# Patient Record
Sex: Female | Born: 1965 | Race: White | Hispanic: No | Marital: Married | Smoking: Never smoker
Health system: Southern US, Community
[De-identification: ages and names within clinical notes are randomized; demographics above are authoritative.]

## PROBLEM LIST (undated history)

## (undated) DIAGNOSIS — E079 Disorder of thyroid, unspecified: Secondary | ICD-10-CM

## (undated) DIAGNOSIS — R0600 Dyspnea, unspecified: Secondary | ICD-10-CM

## (undated) DIAGNOSIS — R002 Palpitations: Secondary | ICD-10-CM

## (undated) DIAGNOSIS — E039 Hypothyroidism, unspecified: Secondary | ICD-10-CM

## (undated) DIAGNOSIS — I82409 Acute embolism and thrombosis of unspecified deep veins of unspecified lower extremity: Secondary | ICD-10-CM

## (undated) DIAGNOSIS — R609 Edema, unspecified: Secondary | ICD-10-CM

## (undated) DIAGNOSIS — I8290 Acute embolism and thrombosis of unspecified vein: Secondary | ICD-10-CM

## (undated) HISTORY — DX: Acute embolism and thrombosis of unspecified deep veins of unspecified lower extremity: I82.409

## (undated) HISTORY — DX: Disorder of thyroid, unspecified: E07.9

## (undated) HISTORY — DX: Hypothyroidism, unspecified: E03.9

## (undated) HISTORY — DX: Edema, unspecified: R60.9

## (undated) HISTORY — DX: Dyspnea, unspecified: R06.00

## (undated) HISTORY — DX: Palpitations: R00.2

## (undated) HISTORY — DX: Acute embolism and thrombosis of unspecified vein: I82.90

---

## 1998-04-16 ENCOUNTER — Other Ambulatory Visit: Admission: RE | Admit: 1998-04-16 | Discharge: 1998-04-16 | Payer: Self-pay | Admitting: Obstetrics and Gynecology

## 1999-07-03 ENCOUNTER — Other Ambulatory Visit: Admission: RE | Admit: 1999-07-03 | Discharge: 1999-07-03 | Payer: Self-pay | Admitting: Obstetrics and Gynecology

## 2000-08-31 ENCOUNTER — Other Ambulatory Visit: Admission: RE | Admit: 2000-08-31 | Discharge: 2000-08-31 | Payer: Self-pay | Admitting: Obstetrics and Gynecology

## 2005-07-13 ENCOUNTER — Other Ambulatory Visit: Admission: RE | Admit: 2005-07-13 | Discharge: 2005-07-13 | Payer: Self-pay | Admitting: Obstetrics and Gynecology

## 2006-11-04 ENCOUNTER — Other Ambulatory Visit: Admission: RE | Admit: 2006-11-04 | Discharge: 2006-11-04 | Payer: Self-pay | Admitting: Obstetrics and Gynecology

## 2007-09-14 ENCOUNTER — Encounter: Admission: RE | Admit: 2007-09-14 | Discharge: 2007-09-14 | Payer: Self-pay | Admitting: Gastroenterology

## 2007-09-19 ENCOUNTER — Encounter: Admission: RE | Admit: 2007-09-19 | Discharge: 2007-09-19 | Payer: Self-pay | Admitting: Gastroenterology

## 2008-09-29 ENCOUNTER — Emergency Department (HOSPITAL_COMMUNITY): Admission: EM | Admit: 2008-09-29 | Discharge: 2008-09-30 | Payer: Self-pay | Admitting: Emergency Medicine

## 2009-02-26 ENCOUNTER — Encounter: Admission: RE | Admit: 2009-02-26 | Discharge: 2009-02-26 | Payer: Self-pay | Admitting: Allergy and Immunology

## 2010-03-25 ENCOUNTER — Emergency Department (HOSPITAL_BASED_OUTPATIENT_CLINIC_OR_DEPARTMENT_OTHER): Admission: EM | Admit: 2010-03-25 | Discharge: 2010-03-25 | Payer: Self-pay | Admitting: Emergency Medicine

## 2010-03-25 ENCOUNTER — Ambulatory Visit: Payer: Self-pay | Admitting: Interventional Radiology

## 2010-12-02 LAB — COMPREHENSIVE METABOLIC PANEL
AST: 34 U/L (ref 0–37)
Albumin: 4.1 g/dL (ref 3.5–5.2)
BUN: 8 mg/dL (ref 6–23)
Creatinine, Ser: 0.83 mg/dL (ref 0.4–1.2)
GFR calc Af Amer: >60 mL/min (ref >60)
Sodium: 141 mEq/L (ref 135–145)
Total Bilirubin: 0.7 mg/dL (ref 0.3–1.2)
Total Protein: 7.5 g/dL (ref 6.0–8.3)

## 2010-12-02 LAB — DIFFERENTIAL
Basophils Absolute: 0 10*3/uL (ref 0.0–0.1)
Basophils Relative: 0 % (ref 0–1)
Lymphocytes Relative: 11 % — ABNORMAL LOW (ref 12–46)
Lymphs Abs: 1.5 10*3/uL (ref 0.7–4.0)
Monocytes Relative: 4 % (ref 3–12)
Neutrophils Relative %: 85 % — ABNORMAL HIGH (ref 43–77)

## 2010-12-02 LAB — CBC
MCV: 90 fL (ref 78.0–100.0)
Platelets: 308 10*3/uL (ref 150–400)
RBC: 4.73 MIL/uL (ref 3.87–5.11)

## 2010-12-02 LAB — GC/CHLAMYDIA PROBE AMP, GENITAL: GC Probe Amp, Genital: NEGATIVE

## 2010-12-02 LAB — URINALYSIS, ROUTINE W REFLEX MICROSCOPIC
Bilirubin Urine: NEGATIVE
Ketones, ur: NEGATIVE mg/dL
Specific Gravity, Urine: 1.011 (ref 1.005–1.030)
Urobilinogen, UA: 0.2 mg/dL (ref 0.0–1.0)

## 2010-12-02 LAB — LIPASE, BLOOD: Lipase: 18 U/L (ref 11–59)

## 2010-12-02 LAB — WET PREP, GENITAL: Trich, Wet Prep: NONE SEEN

## 2013-01-26 ENCOUNTER — Other Ambulatory Visit: Payer: Self-pay | Admitting: Nurse Practitioner

## 2013-01-26 DIAGNOSIS — N644 Mastodynia: Secondary | ICD-10-CM

## 2013-01-26 DIAGNOSIS — N6315 Unspecified lump in the right breast, overlapping quadrants: Secondary | ICD-10-CM

## 2013-02-08 ENCOUNTER — Ambulatory Visit
Admission: RE | Admit: 2013-02-08 | Discharge: 2013-02-08 | Disposition: A | Discharge status: Home / Self Care | Payer: PRIVATE HEALTH INSURANCE | Source: Ambulatory Visit | Attending: Nurse Practitioner | Admitting: Nurse Practitioner

## 2013-02-08 DIAGNOSIS — N644 Mastodynia: Secondary | ICD-10-CM

## 2013-02-08 DIAGNOSIS — N6315 Unspecified lump in the right breast, overlapping quadrants: Secondary | ICD-10-CM

## 2013-09-11 ENCOUNTER — Ambulatory Visit (INDEPENDENT_AMBULATORY_CARE_PROVIDER_SITE_OTHER): Payer: No Typology Code available for payment source | Admitting: Podiatry

## 2013-09-11 ENCOUNTER — Encounter: Payer: Self-pay | Admitting: Podiatry

## 2013-09-11 ENCOUNTER — Ambulatory Visit (INDEPENDENT_AMBULATORY_CARE_PROVIDER_SITE_OTHER): Payer: No Typology Code available for payment source

## 2013-09-11 VITALS — BP 134/88 | HR 86 | Resp 12 | Ht 64.0 in | Wt 190.0 lb

## 2013-09-11 DIAGNOSIS — M722 Plantar fascial fibromatosis: Secondary | ICD-10-CM

## 2013-09-11 DIAGNOSIS — R52 Pain, unspecified: Secondary | ICD-10-CM

## 2013-09-11 MED ORDER — MELOXICAM 15 MG PO TABS: 15.0000 mg | ORAL_TABLET | Freq: Every day | ORAL | Status: DC

## 2013-09-11 MED ORDER — TRIAMCINOLONE ACETONIDE 10 MG/ML IJ SUSP
10.0000 mg | Freq: Once | INTRAMUSCULAR | Status: AC
  Administered 2013-09-11: 10 mg

## 2013-09-11 NOTE — Patient Instructions (Signed)

## 2013-09-11 NOTE — Progress Notes (Signed)
Subjective:     Patient ID: Brandy Dixon, female   DOB: Aug 13, 1966, 48 y.o.   MRN: [id number]  HPI patient presents stating my right heel has been hurting me for several months and making activity difficult especially painful when getting up in the morning   Review of Systems  All other systems reviewed and are negative.       Objective:   Physical Exam  Nursing note and vitals reviewed. Constitutional: She is oriented to person, place, and time.  Cardiovascular: Intact distal pulses.   Musculoskeletal: Normal range of motion.  Neurological: She is oriented to person, place, and time.  Skin: Skin is warm.   neurovascular status intact with no health history changes noted and normal muscle strength with no equinus condition noted. Patient is found to have significant discomfort right plantar fascia at the insertion of the tendon into the calcaneus with inflammation and fluid around the medial band     Assessment:     Plantar fasciitis of an acute nature right heel at the insertion of the tendon into the calcaneus    Plan:     Reviewed condition and x-ray. Injected the right plantar fascia 3 mg Kenalog 5 of Xylocaine Marcaine mixture and apply fascially brace with instructions. Placed on Mobic 15 mg daily

## 2013-09-11 NOTE — Progress Notes (Signed)
N-SORE L-RT FOOT D-2 MONTHS O-SLOWLY C-WORSE A-PRESSURE T-ADVIL

## 2013-09-25 ENCOUNTER — Ambulatory Visit: Payer: No Typology Code available for payment source | Admitting: Podiatry

## 2014-03-20 ENCOUNTER — Other Ambulatory Visit: Payer: Self-pay | Admitting: Nurse Practitioner

## 2014-03-20 DIAGNOSIS — N949 Unspecified condition associated with female genital organs and menstrual cycle: Secondary | ICD-10-CM

## 2014-03-20 DIAGNOSIS — N938 Other specified abnormal uterine and vaginal bleeding: Secondary | ICD-10-CM

## 2014-03-20 DIAGNOSIS — N925 Other specified irregular menstruation: Secondary | ICD-10-CM

## 2014-03-22 ENCOUNTER — Other Ambulatory Visit: Payer: PRIVATE HEALTH INSURANCE

## 2014-04-04 ENCOUNTER — Other Ambulatory Visit: Payer: PRIVATE HEALTH INSURANCE

## 2015-04-25 ENCOUNTER — Other Ambulatory Visit: Payer: Self-pay | Admitting: Pain Medicine

## 2015-04-25 DIAGNOSIS — A7749 Other ehrlichiosis: Secondary | ICD-10-CM

## 2015-04-25 DIAGNOSIS — G9340 Encephalopathy, unspecified: Secondary | ICD-10-CM

## 2015-04-29 ENCOUNTER — Other Ambulatory Visit (HOSPITAL_COMMUNITY): Payer: Self-pay | Admitting: Pain Medicine

## 2015-04-29 DIAGNOSIS — G9340 Encephalopathy, unspecified: Secondary | ICD-10-CM

## 2015-04-29 DIAGNOSIS — A7749 Other ehrlichiosis: Secondary | ICD-10-CM

## 2015-05-02 ENCOUNTER — Ambulatory Visit (HOSPITAL_COMMUNITY)
Admission: RE | Admit: 2015-05-02 | Discharge: 2015-05-02 | Disposition: A | Discharge status: Home / Self Care | Payer: Commercial Managed Care - PPO | Source: Ambulatory Visit | Attending: Pain Medicine | Admitting: Pain Medicine

## 2015-05-07 ENCOUNTER — Other Ambulatory Visit: Payer: PRIVATE HEALTH INSURANCE

## 2015-05-08 ENCOUNTER — Ambulatory Visit (HOSPITAL_COMMUNITY)
Admission: RE | Admit: 2015-05-08 | Discharge: 2015-05-08 | Disposition: A | Discharge status: Home / Self Care | Payer: Commercial Managed Care - PPO | Source: Ambulatory Visit | Attending: Pain Medicine | Admitting: Pain Medicine

## 2015-05-08 DIAGNOSIS — R42 Dizziness and giddiness: Secondary | ICD-10-CM | POA: Diagnosis not present

## 2015-05-08 DIAGNOSIS — A7749 Other ehrlichiosis: Secondary | ICD-10-CM

## 2015-05-08 DIAGNOSIS — G9340 Encephalopathy, unspecified: Secondary | ICD-10-CM

## 2016-06-02 ENCOUNTER — Other Ambulatory Visit: Payer: Self-pay | Admitting: Family Medicine

## 2016-06-02 DIAGNOSIS — R1011 Right upper quadrant pain: Secondary | ICD-10-CM

## 2016-06-09 ENCOUNTER — Other Ambulatory Visit: Payer: Self-pay | Admitting: Gastroenterology

## 2016-06-09 ENCOUNTER — Other Ambulatory Visit (HOSPITAL_COMMUNITY): Payer: Self-pay | Admitting: Gastroenterology

## 2016-06-09 DIAGNOSIS — R1013 Epigastric pain: Secondary | ICD-10-CM

## 2016-06-11 ENCOUNTER — Other Ambulatory Visit: Payer: Commercial Managed Care - PPO

## 2016-06-16 ENCOUNTER — Ambulatory Visit (HOSPITAL_COMMUNITY)
Admission: RE | Admit: 2016-06-16 | Discharge: 2016-06-16 | Disposition: A | Discharge status: Home / Self Care | Payer: Commercial Managed Care - PPO | Source: Ambulatory Visit | Attending: Gastroenterology | Admitting: Gastroenterology

## 2016-06-16 DIAGNOSIS — R1013 Epigastric pain: Secondary | ICD-10-CM | POA: Diagnosis not present

## 2016-06-16 MED ORDER — TECHNETIUM TC 99M MEBROFENIN IV KIT
5.2000 | PACK | Freq: Once | INTRAVENOUS | Status: AC | PRN
  Administered 2016-06-16: 5.2 via INTRAVENOUS

## 2017-01-29 ENCOUNTER — Ambulatory Visit
Admission: RE | Admit: 2017-01-29 | Discharge: 2017-01-29 | Disposition: A | Discharge status: Home / Self Care | Payer: Commercial Managed Care - PPO | Source: Ambulatory Visit | Attending: Cardiology | Admitting: Cardiology

## 2017-01-29 ENCOUNTER — Other Ambulatory Visit: Payer: Self-pay | Admitting: Cardiology

## 2017-01-29 DIAGNOSIS — Z01818 Encounter for other preprocedural examination: Secondary | ICD-10-CM

## 2017-02-14 HISTORY — PX: LIPOSUCTION EXTREMITIES: SUR830

## 2018-02-16 ENCOUNTER — Other Ambulatory Visit: Payer: Self-pay | Admitting: Physician Assistant

## 2018-02-16 DIAGNOSIS — R1011 Right upper quadrant pain: Secondary | ICD-10-CM

## 2018-02-16 DIAGNOSIS — R1115 Cyclical vomiting syndrome unrelated to migraine: Secondary | ICD-10-CM

## 2018-02-21 ENCOUNTER — Ambulatory Visit
Admission: RE | Admit: 2018-02-21 | Discharge: 2018-02-21 | Disposition: A | Discharge status: Home / Self Care | Payer: Commercial Managed Care - PPO | Source: Ambulatory Visit | Attending: Physician Assistant | Admitting: Physician Assistant

## 2018-02-21 DIAGNOSIS — R1011 Right upper quadrant pain: Secondary | ICD-10-CM

## 2018-02-21 DIAGNOSIS — R1115 Cyclical vomiting syndrome unrelated to migraine: Secondary | ICD-10-CM

## 2018-09-27 ENCOUNTER — Ambulatory Visit: Payer: Self-pay | Admitting: Cardiology

## 2018-09-29 ENCOUNTER — Other Ambulatory Visit: Payer: Self-pay | Admitting: Cardiology

## 2018-09-29 ENCOUNTER — Encounter: Payer: Self-pay | Admitting: Cardiology

## 2018-09-29 ENCOUNTER — Ambulatory Visit
Admission: RE | Admit: 2018-09-29 | Discharge: 2018-09-29 | Disposition: A | Discharge status: Home / Self Care | Payer: Commercial Managed Care - PPO | Source: Ambulatory Visit | Attending: Cardiology | Admitting: Cardiology

## 2018-09-29 ENCOUNTER — Ambulatory Visit: Payer: Commercial Managed Care - PPO | Admitting: Cardiology

## 2018-09-29 VITALS — BP 146/95 | HR 79 | Ht 64.0 in | Wt 166.8 lb

## 2018-09-29 DIAGNOSIS — Z01811 Encounter for preprocedural respiratory examination: Secondary | ICD-10-CM

## 2018-09-29 DIAGNOSIS — Z86718 Personal history of other venous thrombosis and embolism: Secondary | ICD-10-CM

## 2018-09-29 DIAGNOSIS — E079 Disorder of thyroid, unspecified: Secondary | ICD-10-CM | POA: Insufficient documentation

## 2018-09-29 DIAGNOSIS — R0600 Dyspnea, unspecified: Secondary | ICD-10-CM | POA: Insufficient documentation

## 2018-09-29 DIAGNOSIS — Z01810 Encounter for preprocedural cardiovascular examination: Secondary | ICD-10-CM | POA: Insufficient documentation

## 2018-09-29 DIAGNOSIS — R609 Edema, unspecified: Secondary | ICD-10-CM | POA: Diagnosis not present

## 2018-09-29 DIAGNOSIS — R002 Palpitations: Secondary | ICD-10-CM | POA: Insufficient documentation

## 2018-09-29 MED ORDER — APIXABAN 2.5 MG PO TABS: 2.5000 mg | ORAL_TABLET | Freq: Two times a day (BID) | ORAL | Status: AC

## 2018-09-29 NOTE — Progress Notes (Signed)
Subjective:   Brandy Dixon, female    DOB: 11/27/1965, 53 y.o.   MRN: [id number]  Kathyrn Lass, MD:  Chief Complaint  Patient presents with  . Pre-op Exam    medical clearance arm & leg surg 3/10    HPI   She was last seen by [place] in June 2018 and I suspected that she has unusual gaining weight with extreme fat deposition and although she thought she had edema, she in fact had lipedemia with folds of fat just beginning to occur at rest and ankle. She underwent routine treadmill stress test on 01/29/2017 and she had normal excess tolerance, hypertension at the exercise but otherwise no significant abnormality. Echocardiogram on 02/02/2017 was unremarkable without any significant valvular abnormality. She had contacted surgeon in [place] and had planned to have 3 sessions of lymphatic preserved liposuction. She is now scheduled for repeat session of liposuction of bilateral legs and scar tissue removal from her left arm and is here for preoperative cardiac risk stratification.   She reports that postoperatively she developed infection in her left arm and she also developed right leg DVT after the procedure likely from immobility and was on oral blood thinner for 3 months that resolved her DVT. In view of this, she will need prophylactic blood thinner after surgery. Dr. Delman Cheadle is her plastic surgeon performing the surgery on 10/25/2018. She has lost approximatley 50 lbs since the surgery. She has occasionally been exercising.   Denies any hypertension, hyperlipidemia, diabetes. She does have hypothyroidism. She has been evaluated with sleep study that she reports was negative. Denies any tobacco use.   Past Medical History:  Diagnosis Date  . Dyspnea   . Lipoedema   . Palpitations   . Thyroid disease    hypothyroidism    Past Surgical History:  Procedure Laterality Date  . LIPOSUCTION EXTREMITIES  02/2017   3 total    Family History  Problem Relation Age of Onset    . Thyroid disease Mother     Social History   Socioeconomic History  . Marital status: Married    Spouse name: Not on file  . Number of children: 2  . Years of education: Not on file  . Highest education level: Not on file  Occupational History  . Not on file  Social Needs  . Financial resource strain: Not on file  . Food insecurity:    Worry: Not on file    Inability: Not on file  . Transportation needs:    Medical: Not on file    Non-medical: Not on file  Tobacco Use  . Smoking status: Never Smoker  . Smokeless tobacco: Never Used  Substance and Sexual Activity  . Alcohol use: Yes    Comment: socially  . Drug use: No  . Sexual activity: Not on file  Lifestyle  . Physical activity:    Days per week: Not on file    Minutes per session: Not on file  . Stress: Not on file  Relationships  . Social connections:    Talks on phone: Not on file    Gets together: Not on file    Attends religious service: Not on file    Active member of club or organization: Not on file    Attends meetings of clubs or organizations: Not on file    Relationship status: Not on file  . Intimate partner violence:    Fear of current or ex partner: Not on file  Emotionally abused: Not on file    Physically abused: Not on file    Forced sexual activity: Not on file  Other Topics Concern  . Not on file  Social History Narrative  . Not on file    Current Meds  Medication Sig  . ARMOUR THYROID 120 MG tablet Take 1 tablet by mouth daily.  Marland Kitchen ascorbic acid (VITAMIN C) 500 MG/5ML syrup Take by mouth daily.  Marland Kitchen FERREX 150 150 MG capsule Take 1 tablet by mouth daily.  . Lactobacillus Rhamnosus, GG, (CULTURELLE) CAPS Take by mouth daily.  Marland Kitchen liothyronine (CYTOMEL) 5 MCG tablet Take 1 tablet by mouth 2 (two) times daily.  . pantoprazole (PROTONIX) 40 MG tablet Take 1 tablet by mouth daily.  . progesterone (PROMETRIUM) 100 MG capsule Take 1 tablet by mouth daily.   Current Facility-Administered  Medications for the 09/29/18 encounter (Office Visit) with Miquel Dunn, NP  Medication  . [START ON 10/25/2018] apixaban (ELIQUIS) tablet 2.5 mg     Review of Systems  Constitution: Negative for decreased appetite, malaise/fatigue, weight gain and weight loss.  Eyes: Negative for visual disturbance.  Cardiovascular: Negative for chest pain, claudication, dyspnea on exertion, leg swelling (occasional at the end of the day bilateral), orthopnea, palpitations and syncope.  Respiratory: Negative for hemoptysis and wheezing.   Endocrine: Negative for cold intolerance and heat intolerance.  Hematologic/Lymphatic: Does not bruise/bleed easily.  Skin: Negative for nail changes.  Musculoskeletal: Negative for muscle weakness and myalgias.  Gastrointestinal: Negative for abdominal pain, change in bowel habit, nausea and vomiting.  Neurological: Negative for difficulty with concentration, dizziness, focal weakness and headaches.  Psychiatric/Behavioral: Negative for altered mental status and suicidal ideas.  All other systems reviewed and are negative.      Objective:     Blood pressure (!) 146/95, pulse 79, height _0  (1.626 m), weight 166 lb 12.8 oz (75.7 kg), SpO2 100 %.  Echocardiogram 02/02/2017: Left ventricle cavity is normal in size. Normal global wall motion. Normal diastolic filling pattern, normal LAP. Calculated EF 55%. Mild (Grade I) mitral regurgitation. Mild tricuspid regurgitation. No evidence of pulmonary hypertension. Trace pulmonic regurgitation.  Treadmill exercise stress test 01/29/2017: Indication: Palpitations, SoB Resting EKG demonstrates NSR. The patient exercised according to Bruce Protocol, Total time recorded 8:29 min achieving max heart rate of 160 which was 93% of THR for age and 10.16 METS of work. Stress terminated due to THR (>85% MPHR)/MPHR met. There was no ST-T changes of ischemia with exercise stress test. There were no significant arrhythmias.  Hypertensive BP response, peak BP 240/72 mm Hg. Rec: No e/o ischemia by GXT. Exercise tolerence is normal. Hypertensive BP response, peak BP 240/72 mm Hg. No arrhythmias.Continue Preventive therapy.  EKG 09/29/2018: Normal sinus rhythm at 67 bpm, normal axis, no evidence of ischemia.   Physical Exam  Constitutional: She is oriented to person, place, and time. Vital signs are normal. She appears well-developed and well-nourished.  Surgery scars on bilateral legs and arms  HENT:  Head: Normocephalic and atraumatic.  Neck: Normal range of motion.  Cardiovascular: Normal rate, regular rhythm, normal heart sounds and intact distal pulses.  Pulmonary/Chest: Effort normal and breath sounds normal. No accessory muscle usage. No respiratory distress.  Abdominal: Soft. Bowel sounds are normal.  Musculoskeletal: Normal range of motion.  Neurological: She is alert and oriented to person, place, and time.  Skin: Skin is warm and dry.  Vitals reviewed.       Assessment & Recommendations:   1.  Preoperative cardiovascular examination Feel that she may be taken for surgery low risk for procedure. Has had negative GXT and normal echo in 2018. Has good functional capacity. Will obtain CBC, BMP, PT/INR, EKG, and CXR for preoperative workup per the request of her surgeon.   2. Lipoedema I am very proud of her progress since we saw her in 2018. She has been making significant diet and lifestyle changes and I have provided positive reinforcement. Encouraged her to continue postoperatively.   3. History of DVT (deep vein thrombosis) Likely related to immobility post-op. Will prescribe Eliquis 2.5 mg BID to be started as soon as possible after surgery or deemed safe by plastic surgeon. Will continue this for 1 month after surgery.   Again, I am very pleased with her progress. I will see her back on a PRN basis.    Jeri Lager, FNP-C [facility], [place] Office: [phone] Fax:  [phone]

## 2018-09-30 ENCOUNTER — Encounter: Payer: Self-pay | Admitting: Cardiology

## 2018-09-30 LAB — BASIC METABOLIC PANEL
BUN/Creatinine Ratio: 19 (ref 9–23)
BUN: 12 mg/dL (ref 6–24)
CO2: 20 mmol/L (ref 20–29)
Calcium: 9.5 mg/dL (ref 8.7–10.2)
Chloride: 106 mmol/L (ref 96–106)
Creatinine, Ser: 0.62 mg/dL (ref 0.57–1.00)
GFR calc Af Amer: 121 mL/min/{1.73_m2} (ref >59)
GFR calc non Af Amer: 105 mL/min/{1.73_m2} (ref >59)
GLUCOSE: 100 mg/dL — AB (ref 65–99)
Potassium: 4.4 mmol/L (ref 3.5–5.2)
Sodium: 140 mmol/L (ref 134–144)

## 2018-09-30 LAB — CBC WITH DIFFERENTIAL/PLATELET
Basophils Absolute: 0 10*3/uL (ref 0.0–0.2)
Basos: 1 %
EOS (ABSOLUTE): 0.1 10*3/uL (ref 0.0–0.4)
Eos: 1 %
Hematocrit: 43.1 % (ref 34.0–46.6)
Hemoglobin: 14.4 g/dL (ref 11.1–15.9)
IMMATURE GRANS (ABS): 0 10*3/uL (ref 0.0–0.1)
Immature Granulocytes: 0 %
LYMPHS: 20 %
Lymphocytes Absolute: 1.2 10*3/uL (ref 0.7–3.1)
MCH: 28.2 pg (ref 26.6–33.0)
MCHC: 33.4 g/dL (ref 31.5–35.7)
MCV: 85 fL (ref 79–97)
Monocytes Absolute: 0.6 10*3/uL (ref 0.1–0.9)
Monocytes: 9 %
Neutrophils Absolute: 4.2 10*3/uL (ref 1.4–7.0)
Neutrophils: 69 %
Platelets: 268 10*3/uL (ref 150–450)
RBC: 5.1 x10E6/uL (ref 3.77–5.28)
RDW: 14.2 % (ref 11.7–15.4)
WBC: 6.1 10*3/uL (ref 3.4–10.8)

## 2018-09-30 LAB — PROTIME-INR
INR: 1 (ref 0.8–1.2)
Prothrombin Time: 10.4 s (ref 9.1–12.0)

## 2018-10-12 ENCOUNTER — Telehealth: Payer: Self-pay

## 2018-10-12 NOTE — Telephone Encounter (Signed)
It looks like it was sent to the pharmacy. Not sure what happened. She will need Eliquis 2.5mg  BID 30 day supply

## 2018-10-12 NOTE — Telephone Encounter (Signed)
Pt called and left vm stating that you were suppose to send in a blood thinner before her surgery on 10/25/2018 she is getting it done in [place] and she leaves on the 6th

## 2018-10-13 ENCOUNTER — Other Ambulatory Visit: Payer: Self-pay

## 2018-10-13 DIAGNOSIS — Z01810 Encounter for preprocedural cardiovascular examination: Secondary | ICD-10-CM

## 2018-10-13 MED ORDER — APIXABAN 2.5 MG PO TABS: 2.5000 mg | ORAL_TABLET | Freq: Two times a day (BID) | ORAL | Status: DC

## 2018-10-17 ENCOUNTER — Other Ambulatory Visit: Payer: Self-pay

## 2018-10-17 MED ORDER — APIXABAN 2.5 MG PO TABS: 2.5000 mg | ORAL_TABLET | Freq: Two times a day (BID) | ORAL | 1 refills | Status: DC

## 2018-12-17 ENCOUNTER — Other Ambulatory Visit: Payer: Self-pay | Admitting: Cardiology

## 2018-12-19 NOTE — Telephone Encounter (Signed)
Please address

## 2018-12-20 NOTE — Telephone Encounter (Signed)
Patient has not returned call regarding Eliquis. Will deny refill request at this time.

## 2020-08-04 ENCOUNTER — Emergency Department (HOSPITAL_COMMUNITY)
Admission: EM | Admit: 2020-08-04 | Discharge: 2020-08-05 | Disposition: A | Discharge status: Home / Self Care | Payer: No Typology Code available for payment source | Attending: Emergency Medicine | Admitting: Emergency Medicine

## 2020-08-04 ENCOUNTER — Encounter (HOSPITAL_COMMUNITY): Payer: Self-pay

## 2020-08-04 ENCOUNTER — Other Ambulatory Visit: Payer: Self-pay

## 2020-08-04 ENCOUNTER — Emergency Department (HOSPITAL_COMMUNITY): Payer: No Typology Code available for payment source

## 2020-08-04 DIAGNOSIS — Z7901 Long term (current) use of anticoagulants: Secondary | ICD-10-CM | POA: Diagnosis not present

## 2020-08-04 DIAGNOSIS — N390 Urinary tract infection, site not specified: Secondary | ICD-10-CM | POA: Insufficient documentation

## 2020-08-04 DIAGNOSIS — Z79899 Other long term (current) drug therapy: Secondary | ICD-10-CM | POA: Diagnosis not present

## 2020-08-04 DIAGNOSIS — R300 Dysuria: Secondary | ICD-10-CM

## 2020-08-04 DIAGNOSIS — E039 Hypothyroidism, unspecified: Secondary | ICD-10-CM | POA: Diagnosis not present

## 2020-08-04 DIAGNOSIS — R109 Unspecified abdominal pain: Secondary | ICD-10-CM | POA: Diagnosis present

## 2020-08-04 LAB — URINALYSIS, ROUTINE W REFLEX MICROSCOPIC
Bilirubin Urine: NEGATIVE
Glucose, UA: NEGATIVE mg/dL
Ketones, ur: NEGATIVE mg/dL
Leukocytes,Ua: NEGATIVE
Nitrite: NEGATIVE
Protein, ur: NEGATIVE mg/dL
Specific Gravity, Urine: 1.021 (ref 1.005–1.030)
pH: 5 (ref 5.0–8.0)

## 2020-08-04 LAB — PREGNANCY, URINE: Preg Test, Ur: NEGATIVE

## 2020-08-04 MED ORDER — ACETAMINOPHEN 500 MG PO TABS
1000.0000 mg | ORAL_TABLET | Freq: Once | ORAL | Status: AC
  Administered 2020-08-04: 1000 mg via ORAL
  Filled 2020-08-04: qty 2

## 2020-08-04 NOTE — ED Triage Notes (Signed)
Pt reports that she started to have UTI symptoms a little over a week ago. Pt states she took one antibiotic, but it did not help. Pt reports she is now taking Bactrim and Oxybutynin. Pt states it feels like she is not fully emptying her bladder. Pt has hx of urinary retention.

## 2020-08-04 NOTE — ED Provider Notes (Signed)
Urinary symptoms - dysuria, 2 rounds of abx without improvement Sense of bladder being full - 110 cc on cath Pending CT scan  Plan: anticipate discharge after scan ?Pyridium, refer to urology  CT scan reviewed: no kidney or ureteral stones. There are degenerative changes of the lumbar spine that may be considered as contributing.   Feel she will need to continue evaluation in the outptient setting. She states her doctor has suggested referral to uro-GYN. She is encouraged to pursue this referral from her PCP.   Oxycodone provided for pain relief.      Elpidio Anis, PA-C 08/05/20 0054    Lorre Nick, MD 08/07/20 2308

## 2020-08-04 NOTE — ED Provider Notes (Signed)
[facility]-EMERGENCY DEPT Provider Note   CSN: [id number] Arrival date & time: 08/04/20  1955     History Chief Complaint  Patient presents with  . Urinary Tract Infection    Brandy Dixon is a 54 y.o. female.  The history is provided by the patient. No language interpreter was used.  Urinary Tract Infection Pain quality:  Aching and stabbing Pain severity:  Moderate Onset quality:  Gradual Duration:  1 week Timing:  Constant Progression:  Worsening Chronicity:  New Recent urinary tract infections: yes   Relieved by:  Nothing Worsened by:  Nothing Associated symptoms: abdominal pain   Pt reports she has had urinary retention in the past and had to have bladder drained. Pt is being treated for a uti. Pt is on second dose of antibiotics     Past Medical History:  Diagnosis Date  . Dyspnea   . Lipoedema   . Palpitations   . Thyroid disease    hypothyroidism    Patient Active Problem List   Diagnosis Date Noted  . Preoperative cardiovascular examination 09/29/2018  . History of DVT (deep vein thrombosis) 09/29/2018  . Palpitations   . Thyroid disease   . Dyspnea   . Lipoedema     Past Surgical History:  Procedure Laterality Date  . LIPOSUCTION EXTREMITIES  02/2017   3 total     OB History   No obstetric history on file.     Family History  Problem Relation Age of Onset  . Thyroid disease Mother     Social History   Tobacco Use  . Smoking status: Never Smoker  . Smokeless tobacco: Never Used  Substance Use Topics  . Alcohol use: Yes    Comment: socially  . Drug use: No    Home Medications Prior to Admission medications   Medication Sig Start Date End Date Taking? Authorizing Provider  apixaban (ELIQUIS) 2.5 MG TABS tablet Take 1 tablet (2.5 mg total) by mouth 2 (two) times daily. 10/17/18   Yates Decamp, MD  ARMOUR THYROID 120 MG tablet Take 1 tablet by mouth daily. 09/25/18   [provider]  ascorbic acid  (VITAMIN C) 500 MG/5ML syrup Take by mouth daily.    [provider]  FERREX 150 150 MG capsule Take 1 tablet by mouth daily. 08/18/18   [provider]  Lactobacillus Rhamnosus, GG, (CULTURELLE) CAPS Take by mouth daily. 03/22/17   [provider]  liothyronine (CYTOMEL) 5 MCG tablet Take 1 tablet by mouth 2 (two) times daily. 09/12/18   [provider]  meloxicam (MOBIC) 15 MG tablet Take 1 tablet (15 mg total) by mouth daily. Patient not taking: Reported on 09/29/2018 09/11/13   Lenn Sink, DPM  pantoprazole (PROTONIX) 40 MG tablet Take 1 tablet by mouth daily. 09/26/18   [provider]  progesterone (PROMETRIUM) 100 MG capsule Take 1 tablet by mouth daily. 09/19/18   [provider]    Allergies    Patient has no known allergies.  Review of Systems   Review of Systems  Gastrointestinal: Positive for abdominal pain.  All other systems reviewed and are negative.   Physical Exam Updated Vital Signs BP (!) 181/98 (BP Location: Left Arm)   Pulse 96   Temp 98 F (36.7 C) (Oral)   Resp 18   Ht 5\' 4"  (1.626 m)   Wt 75.3 kg   SpO2 97%   BMI 28.49 kg/m   Physical Exam Vitals and nursing note reviewed.  Constitutional:      Appearance: She is well-developed and well-nourished.  HENT:     Head: Normocephalic.  Eyes:     Extraocular Movements: EOM normal.  Cardiovascular:     Rate and Rhythm: Normal rate and regular rhythm.  Pulmonary:     Effort: Pulmonary effort is normal.  Abdominal:     General: Abdomen is flat. There is no distension.     Palpations: Abdomen is soft.  Genitourinary:    General: Normal vulva.  Musculoskeletal:        General: Normal range of motion.     Cervical back: Normal range of motion.  Neurological:     Mental Status: She is alert and oriented to person, place, and time.  Psychiatric:        Mood and Affect: Mood and affect and mood normal.     ED Results / Procedures / Treatments    Labs (all labs ordered are listed, but only abnormal results are displayed) Labs Reviewed  URINALYSIS, ROUTINE W REFLEX MICROSCOPIC  PREGNANCY, URINE    EKG None  Radiology No results found.  Procedures Procedures (including critical care time)  Medications Ordered in ED Medications - No data to display  ED Course  I have reviewed the triage vital signs and the nursing notes.  Pertinent labs & imaging results that were available during my care of the patient were reviewed by me and considered in my medical decision making (see chart for details).    MDM Rules/Calculators/A&P                          MDM:  Nurse reports difficulty doin in and out cath.  I did in and out  approx 100 cc of urine clear.  Pt is on menses.  Ua does not explain pt's discomfort.  Pt's care turned over to [facility] Ct renal pending.  Final Clinical Impression(s) / ED Diagnoses Final diagnoses:  None    Rx / DC Orders ED Discharge Orders    None       Osie Cheeks 08/04/20 2344    Lorre Nick, MD 08/07/20 2309

## 2020-08-05 MED ORDER — OXYCODONE HCL 5 MG PO TABS
5.0000 mg | ORAL_TABLET | Freq: Once | ORAL | Status: AC
  Administered 2020-08-05: 5 mg via ORAL
  Filled 2020-08-05: qty 1

## 2020-08-05 MED ORDER — OXYCODONE-ACETAMINOPHEN 5-325 MG PO TABS: 1.0000 | ORAL_TABLET | ORAL | 0 refills | Status: DC | PRN

## 2020-08-05 MED ORDER — OXYCODONE-ACETAMINOPHEN 5-325 MG PO TABS: 1.0000 | ORAL_TABLET | Freq: Once | ORAL | Status: DC

## 2020-08-05 NOTE — Discharge Instructions (Signed)
See your doctor for the referral to the uro-gynecologist as we discussed. Take Percocet for pain as needed.   If you develop any high fever, severe pain, or new concern, please return to the emergency department.

## 2020-08-12 NOTE — Progress Notes (Addendum)
[facility] New Patient Evaluation and Consultation  Referring Provider: Bridgette HabermannBriles, Tosha M, NP PCP: Bridgette HabermannBriles, Tosha M, NP Date of Service: 08/19/2020  SUBJECTIVE Chief Complaint: New Patient (Initial Visit) and Dysuria  History of Present Illness: Brandy Dixon is a 54 y.o. White or Caucasian female seen in consultation at the request of NP Briles for evaluation of dysuria.    Review of records from NP Briles and in Epic significant for: Patient reports frequency, dysuria and inability to void at times. Was previously on Rapaflo for urinary retention. Started on flomax and oxybutynin for recent episode. Was also given ciprofloxacin without improvement.   Went to ER On 08/04/20- Noted to have 110ml on straight cath of bladder. UA with large Hgb (21-50) and +bacteria. Was not sent for culture. Patient was discharged with oxycodone.   08/04/20 CT renal stone study:  IMPRESSION: 1. Colonic diverticulosis. 2. Marked severity degenerative changes at the levels of L2-L3 and L5-S1.  Urinary Symptoms: Does not leak urine.  Week before christmas, she had difficulty urinating. Had extreme pressure and urgency like her bladder was full. Sharp pain present.  Had no improvement with antibiotics. Would alternate urinating small amounts vs normal amounts. Happened previously about 5-6 years ago.  Currently: Day time voids: every few hours.  Nocturia: 0 times per night to void. Voiding dysfunction: she empties her bladder well.  does not use a catheter to empty bladder.  When urinating, she feels the need to urinate multiple times in a row Drinks: 2-30oz bottles of water per day, stopped drinking coffee or tea a few weeks ago.  Has been under some stress lately- new grandbaby and daughter has a wedding coming up  UTIs: 2 UTI's in the last year- had negative cultures but had symptoms.  Denies history of blood in urine and kidney or bladder stones. Had large Hgb on prior UA but had vaginal  bleeding at that time.   Pelvic Organ Prolapse Symptoms:                  She Denies a feeling of a bulge the vaginal area.   Bowel Symptom: Bowel movements: 1 time(s) per day Stool consistency: hard or soft  Straining: no.  Splinting: no.  Incomplete evacuation: no.  She Denies accidental bowel leakage / fecal incontinence Bowel regimen: none  Sexual Function Sexually active: yes.  Pain with sex: No  Pelvic Pain Denies regular pelvic pain but had pain on the left side of the pelvis/ bladder during her episode of bladder spasm.   Past Medical History:  Past Medical History:  Diagnosis Date  . Blood clot in vein   . Dyspnea   . Lipoedema   . Palpitations   . Thyroid disease    hypothyroidism     Past Surgical History:   Past Surgical History:  Procedure Laterality Date  . LIPOSUCTION EXTREMITIES  02/2017   3 total     Past OB/GYN History: G2 P2 Vaginal deliveries: 2,  Forceps/ Vacuum deliveries: 0, Cesarean section: 0 Menopausal: No, LMP- currently bleeding x3 weeks Contraception: none. Last pap smear was 4 years ago.  Any history of abnormal pap smears: no. On compounded estradiol cream, progesterone daily. Reports that she had a history of blood clots in the past while on OCPs.   Medications: She has a current medication list which includes the following prescription(s): armour thyroid, ascorbic acid, premarin, diazepam, ferrex 150, liothyronine, uribel, pantoprazole, and progesterone, and the following Facility-Administered Medications: apixaban.  Allergies: Patient has No Known Allergies.   Social History:  Social History   Tobacco Use  . Smoking status: Never Smoker  . Smokeless tobacco: Never Used  Substance Use Topics  . Alcohol use: Yes    Comment: socially  . Drug use: No    Relationship status: married Regular exercise: Yes: walking   Family History:   Family History  Problem Relation Age of Onset  . Thyroid disease Mother   . Breast  cancer Maternal Grandmother      Review of Systems: Review of Systems  Constitutional: Negative for fever, malaise/fatigue and weight loss.  Respiratory: Negative for cough, shortness of breath and wheezing.   Cardiovascular: Negative for chest pain, palpitations and leg swelling.  Gastrointestinal: Negative for abdominal pain and blood in stool.  Genitourinary: Negative for dysuria.       +abnormal periods  Musculoskeletal: Negative for myalgias.  Skin: Negative for rash.  Neurological: Negative for dizziness and headaches.  Endo/Heme/Allergies: Does not bruise/bleed easily.  Psychiatric/Behavioral: Negative for depression. The patient is not nervous/anxious.      OBJECTIVE Physical Exam: Vitals:   08/19/20 0936  BP: 127/85  Pulse: 91  Weight: 191 lb (86.6 kg)  Height: 5\' 4"  (1.626 m)    Physical Exam Constitutional:      General: She is not in acute distress. Pulmonary:     Effort: Pulmonary effort is normal.  Abdominal:     General: There is no distension.     Palpations: Abdomen is soft.     Tenderness: There is no abdominal tenderness. There is no rebound.  Musculoskeletal:        General: No swelling. Normal range of motion.  Skin:    General: Skin is warm and dry.     Findings: No rash.  Neurological:     Mental Status: She is alert and oriented to person, place, and time.  Psychiatric:        Mood and Affect: Mood normal.        Behavior: Behavior normal.     GU / Detailed Urogynecologic Evaluation:  Pelvic Exam: Normal external female genitalia; Bartholin's and Skene's glands normal in appearance; urethral meatus normal in appearance, no urethral masses or discharge.   CST: negative   Speculum exam reveals normal vaginal mucosa without atrophy. +vaginal bleeding present. Cervix normal appearance. Uterus normal single, nontender. Adnexa no mass, fullness, tenderness.     Pelvic floor strength III/V  Pelvic floor musculature: Right levator  non-tender, Right obturator non-tender, Left levator tender, Left obturator tender  POP-Q:   POP-Q  -0.5                                            Aa   -0.5                                           Ba  -7                                              C   4  Gh  5                                            Pb  10                                            tvl   -1.5                                            Ap  -1.5                                            Bp  -8.5                                              D     Rectal Exam:  Normal external rectum  Post-Void Residual (PVR) by Bladder Scan: In order to evaluate bladder emptying, we discussed obtaining a postvoid residual and she agreed to this procedure.  Procedure: The ultrasound unit was placed on the patient's abdomen in the suprapubic region after the patient had voided. A PVR of 3 ml was obtained by bladder scan.  Laboratory Results: POC urine: large blood but + vaginal bleeding on exam  I visualized the urine specimen, noting the specimen to be amber/ red tinged  ASSESSMENT AND PLAN Ms. Guhl is a 54 y.o. with:  1. Bladder pain   2. Levator spasm   3. Frequency of urination   4. Abnormal uterine bleeding   5. Prolapse of anterior vaginal wall    1. Bladder pain For irritative bladder we reviewed treatment options including altering her diet to avoid irritative beverages and foods as well as attempting to decrease stress and other exacerbating factors.  We also discussed using pyridium and similar over-the-counter medications for pain relief as needed. We discussed the pentad of medications including Elmiron, Tums, an antihistamine such as Vistaril, amitriptyline, and L-arginine.  We also discussed in-office bladder instillations for pain flares, as well as cystoscopy with hydrodistention in the operating room, which can be both diagnostic and therapeutic. She  was also given information on the IC Network at https://www.ic-network.com for bladder diet suggestions and patient forums for support. - Her symptoms a few weeks ago were likely due to a flare. She had improvement after stopping coffee/ tea and will continue to only drink water. She also feels like she was very stressed around the holidays and will work on incorporating a mindfulness or exercise regimen to reduce stress.  - Has tried pyridium previously and did not help her symptoms. Will prescribe Uribel for when she has symptoms.  - Also recommended starting L-arginine since this supplement is available over the counter.   2. Levator spasm The origin of pelvic floor muscle spasm can be multifactorial, including primary, reactive to a different pain source, trauma, or even part of a centralized pain syndrome.Treatment options include pelvic  floor physical therapy, local (vaginal) or oral  muscle relaxants, or centrally acting pain medications.   - Reviewed how levator spasm can exacerbate bladder symptoms and sensation of incomplete emptying. She is interested in pelvic floor physical therapy and referral was placed.  - Also prescribed vaginal valium 5mg  tab to place as needed for pelvic pain/ spasm. Since she was having vaginal bleeding, discussed the pill can also be placed rectally if needed.   3. Urinary frequency - POC urine today negative for infection.   4. AUB - Patient reports she has been bleeding for 3 weeks. She has been prescribed a compounded estradiol cream and progresterone daily by her PCP. However, she reports history of blood clots while on OCPs. While I do not have the prescription information, based on her report, I believe the estrogen cream is a systemic hormonal cream. Discussed that systemic estrogen would not be indicated with this history and she should speak to the prescribing physician.  - She requested referral to gynecology for further management and referral placed.    5. Stage II anterior, Stage I posterior, Stage I apical prolapse - She is currently asymptomatic and I do not feel this is contributing to her symptoms of bladder pain/ pressure. Discussed that we can reevaluate if she becomes symptomatic.   Return 2 months to assess progress   , MD   Medical Decision Making:  - Reviewed/ ordered a clinical laboratory test - Review and summation of prior records

## 2020-08-19 ENCOUNTER — Other Ambulatory Visit: Payer: Self-pay

## 2020-08-19 ENCOUNTER — Encounter: Payer: Self-pay | Admitting: Obstetrics and Gynecology

## 2020-08-19 ENCOUNTER — Ambulatory Visit (INDEPENDENT_AMBULATORY_CARE_PROVIDER_SITE_OTHER): Payer: No Typology Code available for payment source | Admitting: Obstetrics and Gynecology

## 2020-08-19 VITALS — BP 127/85 | HR 91 | Ht 64.0 in | Wt 191.0 lb

## 2020-08-19 DIAGNOSIS — R3989 Other symptoms and signs involving the genitourinary system: Secondary | ICD-10-CM

## 2020-08-19 DIAGNOSIS — N8110 Cystocele, unspecified: Secondary | ICD-10-CM | POA: Diagnosis not present

## 2020-08-19 DIAGNOSIS — N939 Abnormal uterine and vaginal bleeding, unspecified: Secondary | ICD-10-CM

## 2020-08-19 DIAGNOSIS — R350 Frequency of micturition: Secondary | ICD-10-CM

## 2020-08-19 DIAGNOSIS — M62838 Other muscle spasm: Secondary | ICD-10-CM

## 2020-08-19 LAB — POCT URINALYSIS DIPSTICK
Appearance: ABNORMAL
Bilirubin, UA: NEGATIVE
Glucose, UA: NEGATIVE
Ketones, UA: NEGATIVE
Leukocytes, UA: NEGATIVE
Nitrite, UA: NEGATIVE
Protein, UA: NEGATIVE
Spec Grav, UA: 1.025 (ref 1.010–1.025)
Urobilinogen, UA: NEGATIVE E.U./dL — AB
pH, UA: 5 (ref 5.0–8.0)

## 2020-08-19 MED ORDER — DIAZEPAM 5 MG PO TABS: ORAL_TABLET | ORAL | 0 refills | Status: DC

## 2020-08-19 MED ORDER — URIBEL 118 MG PO CAPS: 1.0000 | ORAL_CAPSULE | Freq: Four times a day (QID) | ORAL | 2 refills | Status: DC | PRN

## 2020-08-19 NOTE — Patient Instructions (Signed)
Interstitial Cystitis/ Bladder Pain treatment:  . Avoid irriative foods and beverages and identify other triggers . Work to decrease stress- meditation, yoga . Medications for bladder pain: pyridium (Azo), methenamine, Uribel . Medications: amitiptyline, hydroxyzine (anti-histamine), tums, L-arginine, Elmiron  . Bladder instillations for pain flares . Cystoscopy with hydrodistention  The IC Network at https://www.ic-network.com is helpful for bladder diet suggestions and forums for support.  I will prescribe valium 5 mg pills to place vaginally up to 2 times a day for vaginal muscle spasms. Start at night and take one every night for the next several weeks to see if it improves your symptoms. Once you are improving you can taper off the medication and just use as needed. If the medication makes you drowsy then only use at bedtime and/or we can reduce the dose. Do not use gel to place it, as that will prevent the tablet from dissolving.  Instead place 1-2 drops of water on the table before inserting it in the vagina. If the pills do not dissolve well we can switch to a special compounded suppository. Let me know how you are doing on the medication and if you have any questions.

## 2020-09-11 ENCOUNTER — Encounter: Payer: No Typology Code available for payment source | Admitting: Obstetrics & Gynecology

## 2020-09-17 DIAGNOSIS — M5136 Other intervertebral disc degeneration, lumbar region: Secondary | ICD-10-CM | POA: Insufficient documentation

## 2020-10-21 NOTE — Progress Notes (Deleted)
[facility] Return Visit  SUBJECTIVE  History of Present Illness: Brandy Dixon is a 55 y.o. female seen in follow-up for bladder pain and levator spasm. Plan at last visit was to reduce bladder irritants, and start uribel and L-arginine for bladder pain. She was also prescribed vaginal valium and pelvic floor PT for her levator spasm. She was referred to gynecology for AUB.     Past Medical History: Patient  has a past medical history of Blood clot in vein, Dyspnea, Lipoedema, Palpitations, and Thyroid disease.   Past Surgical History: She  has a past surgical history that includes Liposuction extremities (02/2017).   Medications: She has a current medication list which includes the following prescription(s): armour thyroid, ascorbic acid, premarin, diazepam, ferrex 150, liothyronine, uribel, pantoprazole, and progesterone, and the following Facility-Administered Medications: apixaban.   Allergies: Patient has No Known Allergies.   Social History: Patient  reports that she has never smoked. She has never used smokeless tobacco. She reports current alcohol use. She reports that she does not use drugs.      OBJECTIVE     Physical Exam: There were no vitals filed for this visit. Gen: No apparent distress, A&O x 3.  Detailed Urogynecologic Evaluation:  Deferred. Prior exam showed:  No flowsheet data found.     ASSESSMENT AND PLAN    Brandy Dixon is a 55 y.o. with:  No diagnosis found.

## 2020-10-23 ENCOUNTER — Ambulatory Visit: Payer: No Typology Code available for payment source | Admitting: Obstetrics and Gynecology

## 2020-10-30 NOTE — Progress Notes (Deleted)
[facility] Return Visit  SUBJECTIVE  History of Present Illness: Brandy Dixon is a 55 y.o. female seen in follow-up for bladder pain and levator spasm. Plan at last visit was to start vaginal valium and referral was placed for pelvic PT. For bladder pain she started uribel and L-arginine.     Past Medical History: Patient  has a past medical history of Blood clot in vein, Dyspnea, Lipoedema, Palpitations, and Thyroid disease.   Past Surgical History: She  has a past surgical history that includes Liposuction extremities (02/2017).   Medications: She has a current medication list which includes the following prescription(s): armour thyroid, ascorbic acid, premarin, diazepam, ferrex 150, liothyronine, uribel, pantoprazole, and progesterone, and the following Facility-Administered Medications: apixaban.   Allergies: Patient has No Known Allergies.   Social History: Patient  reports that she has never smoked. She has never used smokeless tobacco. She reports current alcohol use. She reports that she does not use drugs.      OBJECTIVE     Physical Exam: There were no vitals filed for this visit. Gen: No apparent distress, A&O x 3.  Detailed Urogynecologic Evaluation:  Deferred. Prior exam showed:  No flowsheet data found.     ASSESSMENT AND PLAN    Brandy Dixon is a 55 y.o. with:  No diagnosis found.

## 2020-11-05 ENCOUNTER — Ambulatory Visit: Payer: No Typology Code available for payment source | Admitting: Obstetrics and Gynecology

## 2020-11-11 ENCOUNTER — Other Ambulatory Visit: Payer: Self-pay | Admitting: Family Medicine

## 2020-11-11 DIAGNOSIS — R1013 Epigastric pain: Secondary | ICD-10-CM

## 2020-11-11 DIAGNOSIS — R109 Unspecified abdominal pain: Secondary | ICD-10-CM

## 2020-11-15 ENCOUNTER — Encounter (HOSPITAL_COMMUNITY): Payer: Self-pay

## 2020-11-15 ENCOUNTER — Emergency Department (HOSPITAL_COMMUNITY): Payer: No Typology Code available for payment source

## 2020-11-15 ENCOUNTER — Other Ambulatory Visit: Payer: Self-pay

## 2020-11-15 ENCOUNTER — Emergency Department (HOSPITAL_COMMUNITY)
Admission: EM | Admit: 2020-11-15 | Discharge: 2020-11-16 | Disposition: A | Discharge status: Home / Self Care | Payer: No Typology Code available for payment source | Attending: Emergency Medicine | Admitting: Emergency Medicine

## 2020-11-15 DIAGNOSIS — R1084 Generalized abdominal pain: Secondary | ICD-10-CM | POA: Insufficient documentation

## 2020-11-15 DIAGNOSIS — R1013 Epigastric pain: Secondary | ICD-10-CM | POA: Diagnosis not present

## 2020-11-15 DIAGNOSIS — Z79899 Other long term (current) drug therapy: Secondary | ICD-10-CM | POA: Insufficient documentation

## 2020-11-15 DIAGNOSIS — R1033 Periumbilical pain: Secondary | ICD-10-CM | POA: Insufficient documentation

## 2020-11-15 DIAGNOSIS — R110 Nausea: Secondary | ICD-10-CM | POA: Insufficient documentation

## 2020-11-15 DIAGNOSIS — R140 Abdominal distension (gaseous): Secondary | ICD-10-CM | POA: Insufficient documentation

## 2020-11-15 DIAGNOSIS — R109 Unspecified abdominal pain: Secondary | ICD-10-CM

## 2020-11-15 DIAGNOSIS — R1011 Right upper quadrant pain: Secondary | ICD-10-CM | POA: Diagnosis present

## 2020-11-15 DIAGNOSIS — E039 Hypothyroidism, unspecified: Secondary | ICD-10-CM | POA: Insufficient documentation

## 2020-11-15 LAB — URINALYSIS, ROUTINE W REFLEX MICROSCOPIC
Bilirubin Urine: NEGATIVE
Glucose, UA: NEGATIVE mg/dL
Hgb urine dipstick: NEGATIVE
Ketones, ur: NEGATIVE mg/dL
Leukocytes,Ua: NEGATIVE
Nitrite: NEGATIVE
Protein, ur: NEGATIVE mg/dL
Specific Gravity, Urine: 1.008 (ref 1.005–1.030)
pH: 5 (ref 5.0–8.0)

## 2020-11-15 LAB — COMPREHENSIVE METABOLIC PANEL
ALT: 32 U/L (ref 0–44)
AST: 23 U/L (ref 15–41)
Albumin: 4.5 g/dL (ref 3.5–5.0)
Alkaline Phosphatase: 51 U/L (ref 38–126)
Anion gap: 9 (ref 5–15)
BUN: 11 mg/dL (ref 6–20)
CO2: 24 mmol/L (ref 22–32)
Calcium: 9.7 mg/dL (ref 8.9–10.3)
Chloride: 105 mmol/L (ref 98–111)
Creatinine, Ser: 0.76 mg/dL (ref 0.44–1.00)
GFR, Estimated: >60 mL/min (ref >60)
Glucose, Bld: 107 mg/dL — ABNORMAL HIGH (ref 70–99)
Potassium: 4.1 mmol/L (ref 3.5–5.1)
Sodium: 138 mmol/L (ref 135–145)
Total Bilirubin: 1 mg/dL (ref 0.3–1.2)
Total Protein: 7.8 g/dL (ref 6.5–8.1)

## 2020-11-15 LAB — I-STAT BETA HCG BLOOD, ED (MC, WL, AP ONLY): I-stat hCG, quantitative: <5 m[IU]/mL (ref <5)

## 2020-11-15 LAB — CBC
HCT: 43.4 % (ref 36.0–46.0)
Hemoglobin: 15.1 g/dL — ABNORMAL HIGH (ref 12.0–15.0)
MCH: 30.4 pg (ref 26.0–34.0)
MCHC: 34.8 g/dL (ref 30.0–36.0)
MCV: 87.3 fL (ref 80.0–100.0)
Platelets: 276 10*3/uL (ref 150–400)
RBC: 4.97 MIL/uL (ref 3.87–5.11)
RDW: 12.1 % (ref 11.5–15.5)
WBC: 5.9 10*3/uL (ref 4.0–10.5)
nRBC: 0 % (ref 0.0–0.2)

## 2020-11-15 LAB — TROPONIN I (HIGH SENSITIVITY)
Troponin I (High Sensitivity): 3 ng/L (ref <18)
Troponin I (High Sensitivity): <2 ng/L (ref <18)

## 2020-11-15 LAB — LIPASE, BLOOD: Lipase: 31 U/L (ref 11–51)

## 2020-11-15 MED ORDER — ONDANSETRON 4 MG PO TBDP
4.0000 mg | ORAL_TABLET | Freq: Once | ORAL | Status: AC
  Administered 2020-11-15: 4 mg via ORAL
  Filled 2020-11-15: qty 1

## 2020-11-15 MED ORDER — IOHEXOL 300 MG/ML  SOLN
100.0000 mL | Freq: Once | INTRAMUSCULAR | Status: AC | PRN
  Administered 2020-11-15: 100 mL via INTRAVENOUS

## 2020-11-15 MED ORDER — IBUPROFEN 800 MG PO TABS
800.0000 mg | ORAL_TABLET | Freq: Once | ORAL | Status: AC
  Administered 2020-11-15: 800 mg via ORAL
  Filled 2020-11-15: qty 1

## 2020-11-15 MED ORDER — MORPHINE SULFATE (PF) 4 MG/ML IV SOLN
4.0000 mg | Freq: Once | INTRAVENOUS | Status: AC
  Administered 2020-11-15: 4 mg via INTRAVENOUS
  Filled 2020-11-15: qty 1

## 2020-11-15 MED ORDER — FENTANYL CITRATE (PF) 100 MCG/2ML IJ SOLN
50.0000 ug | Freq: Once | INTRAMUSCULAR | Status: AC
  Administered 2020-11-15: 50 ug via INTRAVENOUS
  Filled 2020-11-15: qty 2

## 2020-11-15 NOTE — ED Provider Notes (Signed)
[facility]-EMERGENCY DEPT Provider Note   CSN: [id number] Arrival date & time: 11/15/20  1730     History Chief Complaint  Patient presents with  . Abdominal Pain    Brandy Katoicole Dixon is a 55 y.o. female who presents to the emergency department for worsening right upper quadrant pain, nausea, and decreased ability to pass stool and gas.  Patient has appointment to follow-up with gastroenterology next week.  But states that the pain has become so severe she is unable to sleep, and is increasingly worse when she eats.  She states pain becomes 10 of 10 after she eats so she has been taking in very little p.o. for the last 10 days.  She states last bowel movement was yesterday that was very small in amount and difficult for her to pass.  Additionally states she feels she has a lot of bowel gas but is unable to pass it.  She denies any chest pain or shortness of breath. Endorses abdominal distention/ bloating.  Personally reviewed this patient's medical records she has history of palpitations, thyroid disease, and DVT.  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     HPI     Past Medical History:  Diagnosis Date  . Blood clot in vein   . Dyspnea   . Lipoedema   . Palpitations   . Thyroid disease    hypothyroidism    Patient Active Problem List   Diagnosis Date Noted  . Preoperative cardiovascular examination 09/29/2018  . History of DVT (deep vein thrombosis) 09/29/2018  . Palpitations   . Thyroid disease   . Dyspnea   . Lipoedema     Past Surgical History:  Procedure Laterality Date  . LIPOSUCTION EXTREMITIES  02/2017   3 total     OB History    Gravida  2   Para  2   Term  2   Preterm      AB      Living        SAB      IAB      Ectopic      Multiple      Live Births              Family History  Problem Relation Age of Onset  . Thyroid disease Mother   . Breast cancer Maternal Grandmother     Social History    Tobacco Use  . Smoking status: Never Smoker  . Smokeless tobacco: Never Used  Substance Use Topics  . Alcohol use: Yes    Comment: socially  . Drug use: No    Home Medications Prior to Admission medications   Medication Sig Start Date End Date Taking? Authorizing Provider  ascorbic acid (VITAMIN C) 500 MG/5ML syrup Take 500 mg by mouth daily.   Yes [provider]  clarithromycin (BIAXIN) 500 MG tablet Take 500 mg by mouth 2 (two) times daily. Start date : 11/12/20 11/12/20  Yes [provider]  eszopiclone (LUNESTA) 2 MG TABS tablet Take 2 mg by mouth at bedtime. 10/23/20  Yes [provider]  FERREX 150 150 MG capsule Take 1 tablet by mouth daily. 08/18/18  Yes [provider]  fluconazole (DIFLUCAN) 150 MG tablet Take 150 mg by mouth every 3 (three) days. 11/12/20  Yes [provider]  liothyronine (CYTOMEL) 5 MCG tablet Take 5 mcg by mouth daily. 09/12/18  Yes [provider]  ondansetron (ZOFRAN-ODT) 4 MG disintegrating tablet Take 4 mg by  mouth 3 (three) times daily as needed for nausea or vomiting. 11/06/20  Yes [provider]  pantoprazole (PROTONIX) 40 MG tablet Take 40 mg by mouth 2 (two) times daily. 09/26/18  Yes [provider]  progesterone (PROMETRIUM) 100 MG capsule Take 1 tablet by mouth daily. 09/19/18  Yes [provider]  Semaglutide,0.25 or 0.5MG /DOS, (OZEMPIC, 0.25 OR 0.5 MG/DOSE,) 2 MG/1.5ML SOPN Inject 0.25 mg into the skin once a week.   Yes [provider]  thyroid (ARMOUR) 90 MG tablet Take 90 mg by mouth daily.   Yes [provider]  valACYclovir (VALTREX) 1000 MG tablet Take 1,000 mg by mouth daily. 10/25/20  Yes [provider]  amoxicillin (AMOXIL) 875 MG tablet Take 875 mg by mouth 2 (two) times daily. Start date : 11/12/20 11/12/20   [provider]  diazepam (VALIUM) 5 MG tablet Place 1 tablet vaginally nightly as needed for muscle spasm/ pelvic  pain. Patient not taking: No sig reported 08/19/20   Marguerita Beards, MD  Meth-Hyo-M Salley Hews Phos-Ph Sal (URIBEL) 118 MG CAPS Take 1 capsule (118 mg total) by mouth 4 (four) times daily as needed (bladder pain). Patient not taking: No sig reported 08/19/20   Marguerita Beards, MD    Allergies    Patient has no known allergies.  Review of Systems   Review of Systems  Constitutional: Positive for appetite change and fatigue. Negative for activity change, chills, diaphoresis and fever.  HENT: Negative.   Respiratory: Negative.   Cardiovascular: Negative.   Gastrointestinal: Positive for abdominal pain, constipation and nausea. Negative for diarrhea and vomiting.  Genitourinary: Negative for difficulty urinating, dysuria, hematuria, pelvic pain and urgency.  Musculoskeletal: Negative.   Neurological: Negative.   Psychiatric/Behavioral: Negative.     Physical Exam Updated Vital Signs BP 125/76   Pulse 77   Temp 98.4 F (36.9 C) (Oral)   Resp 15   Ht 5\' 4"  (1.626 m)   Wt 79.4 kg   SpO2 97%   BMI 30.04 kg/m   Physical Exam Vitals and nursing note reviewed.  Constitutional:      Appearance: She is ill-appearing.  HENT:     Head: Normocephalic and atraumatic.     Nose: Nose normal.     Mouth/Throat:     Mouth: Mucous membranes are moist.     Pharynx: Oropharynx is clear. Uvula midline. No oropharyngeal exudate or posterior oropharyngeal erythema.     Tonsils: No tonsillar exudate.  Eyes:     General: Lids are normal. Vision grossly intact.        Right eye: No discharge.        Left eye: No discharge.     Extraocular Movements: Extraocular movements intact.     Conjunctiva/sclera: Conjunctivae normal.     Pupils: Pupils are equal, round, and reactive to light.  Neck:     Trachea: Trachea and phonation normal.  Cardiovascular:     Rate and Rhythm: Normal rate and regular rhythm.     Pulses: Normal pulses.          Radial pulses are 2+ on the right side and 2+ on  the left side.       Dorsalis pedis pulses are 2+ on the right side and 2+ on the left side.     Heart sounds: Normal heart sounds. No murmur heard.   Pulmonary:     Effort: Pulmonary effort is normal. No tachypnea, bradypnea, prolonged expiration or respiratory distress.  Breath sounds: Normal breath sounds. No wheezing or rales.  Chest:     Chest wall: No mass, deformity, swelling, tenderness, crepitus or edema.  Abdominal:     General: Bowel sounds are normal. There is no distension.     Palpations: Abdomen is soft.     Tenderness: There is generalized abdominal tenderness and tenderness in the right upper quadrant, epigastric area and periumbilical area. There is no right CVA tenderness, left CVA tenderness, guarding or rebound. Positive signs include Murphy's sign.  Musculoskeletal:        General: No deformity.     Cervical back: Neck supple. No rigidity or crepitus. No pain with movement, spinous process tenderness or muscular tenderness.     Right lower leg: No edema.     Left lower leg: No edema.  Lymphadenopathy:     Cervical: No cervical adenopathy.  Skin:    General: Skin is warm and dry.  Neurological:     General: No focal deficit present.     Mental Status: She is alert. Mental status is at baseline.     Sensory: Sensation is intact.     Motor: Motor function is intact.     Gait: Gait is intact.  Psychiatric:        Mood and Affect: Mood normal.     ED Results / Procedures / Treatments   Labs (all labs ordered are listed, but only abnormal results are displayed) Labs Reviewed  COMPREHENSIVE METABOLIC PANEL - Abnormal; Notable for the following components:      Result Value   Glucose, Bld 107 (*)    All other components within normal limits  CBC - Abnormal; Notable for the following components:   Hemoglobin 15.1 (*)    All other components within normal limits  LIPASE, BLOOD  URINALYSIS, ROUTINE W REFLEX MICROSCOPIC  I-STAT BETA HCG BLOOD, ED ([facility], [facility],  [facility] ONLY)  TROPONIN I (HIGH SENSITIVITY)  TROPONIN I (HIGH SENSITIVITY)    EKG None  Radiology DG Chest 2 View  Result Date: 11/15/2020 CLINICAL DATA:  Right-sided abdominal pain and nausea. Increased pain with eating. EXAM: CHEST - 2 VIEW COMPARISON:  None. FINDINGS: Heart size normal. Ill-defined right lower lobe airspace opacity present. Left lung is clear. No significant edema or effusion is present. Axial skeleton is within normal limits. IMPRESSION: Ill-defined right lower lobe airspace disease worrisome for pneumonia. Electronically Signed   By: Marin Roberts M.D.   On: 11/15/2020 18:45   CT Abdomen Pelvis W Contrast  Result Date: 11/15/2020 CLINICAL DATA:  Abdominal pain EXAM: CT ABDOMEN AND PELVIS WITH CONTRAST TECHNIQUE: Multidetector CT imaging of the abdomen and pelvis was performed using the standard protocol following bolus administration of intravenous contrast. CONTRAST:  OMNIPAQUE IOHEXOL 300 MG/ML  SOLN COMPARISON:  08/04/2020 FINDINGS: Lower chest: Lung bases are clear. No effusions. Heart is normal size. Hepatobiliary: No focal hepatic abnormality. Gallbladder unremarkable. Pancreas: No focal abnormality or ductal dilatation. Spleen: No focal abnormality.  Normal size. Adrenals/Urinary Tract: No adrenal abnormality. No focal renal abnormality. No stones or hydronephrosis. Urinary bladder is unremarkable. Stomach/Bowel: Large duodenal diverticulum measures up to 6 cm. Single mildly prominent upper abdominal small bowel loops may be related to focal ileus. Remainder of the small bowel and stomach are decompressed. Moderate stool throughout the colon. Appendix normal. Vascular/Lymphatic: No evidence of aneurysm or adenopathy. Reproductive: Uterus and adnexa unremarkable.  No mass. Other: No free fluid or free air. Musculoskeletal: No acute bony abnormality. IMPRESSION: Moderate stool throughout  the colon. Large duodenal diverticulum, 6 cm. Single prominent small bowel loops  in the upper abdomen, question focal ileus. Remainder the small bowel is decompressed. Electronically Signed   By: Charlett Nose M.D.   On: 11/15/2020 19:58    Procedures Procedures   Medications Ordered in ED Medications  morphine 4 MG/ML injection 4 mg (has no administration in time range)  ondansetron (ZOFRAN-ODT) disintegrating tablet 4 mg (4 mg Oral Given 11/15/20 1956)  ibuprofen (ADVIL) tablet 800 mg (800 mg Oral Given 11/15/20 1956)  iohexol (OMNIPAQUE) 300 MG/ML solution 100 mL (100 mLs Intravenous Contrast Given 11/15/20 1932)  fentaNYL (SUBLIMAZE) injection 50 mcg (50 mcg Intravenous Given 11/15/20 2059)    ED Course  I have reviewed the triage vital signs and the nursing notes.  Pertinent labs & imaging results that were available during my care of the patient were reviewed by me and considered in my medical decision making (see chart for details).    MDM Rules/Calculators/A&P                          55 year old female presents with concern for 10 days of breath with worsening right upper quadrant pain, nausea, and difficulty passing bowel movements.  Vital signs are normal on intake.  Cardiopulmonary exam is normal, abdominal exam revealed epigastric and right upper quadrant tenderness to palpation without guarding or rebound.  Patient neurovascular intact in all 4 extremities.  CBC is unremarkable, CMP is unremarkable, UA unremarkable, lipase is normal, troponin is negative.  Chest x-ray revealed ill-defined right lower lobe airspace disease concerning for possible pneumonia, however patient does not currently have any symptoms.  CT of the abdomen pelvis revealed moderate stool throughout the colon, large duodenal diverticulum of 6 cm, and single prominent small bowel loop in the upper abdomen questionable for focal ileus.  Remainder of the small bowel is decompressed.  Plan at this time is to attempt to control patient's pain via IV medication, and to administer an enema to  attempt relief of constipation.  Kinslea voiced understanding for medical evaluation treatment plan.  For questions answered to her expressed satisfaction.  Care of this patient was signed out to oncoming ED provider, Sharilyn Sites, PA-C at time of shift change.  All pertinent HPI, physical exam, laboratory findings were reviewed with her prior to my departure.  I appreciate her collaboration in the care of this patient.  This chart was dictated using voice recognition software, Dragon. Despite the best efforts of this provider to proofread and correct errors, errors may still occur which can change documentation meaning.  Final Clinical Impression(s) / ED Diagnoses Final diagnoses:  None    Rx / DC Orders ED Discharge Orders    None       Sherrilee Gilles 11/15/20 2241    Lorre Nick, MD 11/18/20 [phone]

## 2020-11-15 NOTE — ED Provider Notes (Signed)
Patient seen in the triage for MSE  Chief Complaint: RUQ pain  HPI:   RUQ pain x 10 days, worse after eating, pain radiates into chest and back. Associated Nausea, denies vomiting, denies diarrhea  ROS: RUQ pain, Chest pain, Nausea, sleep disturbance  Physical Exam:   Gen: No distress  Neuro: Awake and Alert  Skin: Warm    Focused Exam: RUQ TTP, epigastric TTP, Cardiopulmonary exam is normal  Initiation of care has begun. The patient has been counseled on the process, plan, and necessity for staying for the completion/evaluation, and the remainder of the medical screening examination  MSE was initiated and I personally evaluated the patient and placed orders (if any) at  6:04 PM on November 15, 2020.  The patient appears stable so that the remainder of the MSE may be completed by another provider.   Sherrilee Gilles 11/15/20 1806    Lorre Nick, MD 11/18/20 [phone]

## 2020-11-15 NOTE — ED Triage Notes (Signed)
Pt reports right sided abdominal pain and nausea. Pt reports severe increase in pain when eating. Pt states she has a [place] on Monday that was normal.

## 2020-11-16 MED ORDER — DICYCLOMINE HCL 20 MG PO TABS: 20.0000 mg | ORAL_TABLET | Freq: Two times a day (BID) | ORAL | 0 refills | Status: DC

## 2020-11-16 NOTE — Discharge Instructions (Signed)
Recommend small meals as tolerated.  Lots of fluids to stay hydrated. Can try using daily stool softener such as MiraLAX, Colace, or Dulcolax to keep bowel movements soft and regulated. Follow-up with GI as scheduled. Return here for new concerns.

## 2020-11-16 NOTE — ED Provider Notes (Signed)
Assumed care from PA Sponsellar.  See prior notes for full H&P.  Briefly, 55 y.o. F here with abdominal pain, bloating.   CT with findings of stool burden and questionable ileus.  CXR with questionable CAP, however no cough/fever and this is not visualized on CT.  Not felt to represent acute pneumonia by initial team.  Plan: Pending enema.  Anticipate discharge if feeling better.  1:59 AM Large BM after enema.  States she is feeling better and abdomen does not feel distended anymore.  She is passing some gas now.  No vomiting, tolerating PO.  VSS.  At this point, feel she is stable for discharge.  She has scheduled follow-up with GI in 4 days.  Recommend may want to use daily stool softener, frequent small meals as tolerated, lots of fluids to stay hydrated.  Follow-up with GI as scheduled.  Return here for new concerns.   Garlon Hatchet, PA-C 11/16/20 0383    Paula Libra, MD 11/16/20 [phone]

## 2020-11-18 ENCOUNTER — Other Ambulatory Visit: Payer: No Typology Code available for payment source

## 2020-11-20 ENCOUNTER — Ambulatory Visit
Admission: RE | Admit: 2020-11-20 | Discharge: 2020-11-20 | Disposition: A | Discharge status: Home / Self Care | Payer: No Typology Code available for payment source | Source: Ambulatory Visit | Attending: Physician Assistant | Admitting: Physician Assistant

## 2020-11-20 ENCOUNTER — Other Ambulatory Visit: Payer: Self-pay | Admitting: Physician Assistant

## 2020-11-20 DIAGNOSIS — K567 Ileus, unspecified: Secondary | ICD-10-CM

## 2020-11-27 ENCOUNTER — Ambulatory Visit
Admission: RE | Admit: 2020-11-27 | Discharge: 2020-11-27 | Disposition: A | Discharge status: Home / Self Care | Payer: No Typology Code available for payment source | Source: Ambulatory Visit | Attending: Physician Assistant | Admitting: Physician Assistant

## 2020-11-27 ENCOUNTER — Other Ambulatory Visit: Payer: Self-pay

## 2020-11-27 ENCOUNTER — Other Ambulatory Visit: Payer: Self-pay | Admitting: Physician Assistant

## 2020-11-27 DIAGNOSIS — K567 Ileus, unspecified: Secondary | ICD-10-CM

## 2020-11-27 MED ORDER — IOPAMIDOL (ISOVUE-300) INJECTION 61%
100.0000 mL | Freq: Once | INTRAVENOUS | Status: AC | PRN
  Administered 2020-11-27: 100 mL via INTRAVENOUS

## 2020-11-27 NOTE — Progress Notes (Signed)
Patient brought to the nursing station after her CT scan with contrast here.  She experienced more of an anxiety attack, she states, than an allergic reaction.  She denies hives, any itching or shortness of breath.  Dr. Charise Killian in to see and speak with patient.  After about seven minutes she stated she felt fine and asked to leave.

## 2020-12-02 ENCOUNTER — Other Ambulatory Visit (HOSPITAL_COMMUNITY): Payer: Self-pay | Admitting: Physician Assistant

## 2020-12-02 ENCOUNTER — Other Ambulatory Visit: Payer: Self-pay | Admitting: Physician Assistant

## 2020-12-02 DIAGNOSIS — R1013 Epigastric pain: Secondary | ICD-10-CM

## 2020-12-02 DIAGNOSIS — R1011 Right upper quadrant pain: Secondary | ICD-10-CM

## 2020-12-03 ENCOUNTER — Other Ambulatory Visit: Payer: Self-pay | Admitting: Family Medicine

## 2020-12-03 ENCOUNTER — Other Ambulatory Visit (HOSPITAL_COMMUNITY): Payer: Self-pay | Admitting: Family Medicine

## 2020-12-03 DIAGNOSIS — R1013 Epigastric pain: Secondary | ICD-10-CM

## 2020-12-03 DIAGNOSIS — R110 Nausea: Secondary | ICD-10-CM

## 2020-12-18 ENCOUNTER — Ambulatory Visit: Payer: No Typology Code available for payment source | Admitting: Obstetrics and Gynecology

## 2020-12-19 ENCOUNTER — Ambulatory Visit: Payer: No Typology Code available for payment source | Admitting: Obstetrics and Gynecology

## 2020-12-20 ENCOUNTER — Ambulatory Visit (HOSPITAL_COMMUNITY)
Admission: RE | Admit: 2020-12-20 | Discharge: 2020-12-20 | Disposition: A | Discharge status: Home / Self Care | Payer: No Typology Code available for payment source | Source: Ambulatory Visit | Attending: Physician Assistant | Admitting: Physician Assistant

## 2020-12-20 ENCOUNTER — Other Ambulatory Visit: Payer: Self-pay

## 2020-12-20 DIAGNOSIS — R1011 Right upper quadrant pain: Secondary | ICD-10-CM | POA: Diagnosis present

## 2020-12-20 DIAGNOSIS — R1013 Epigastric pain: Secondary | ICD-10-CM | POA: Diagnosis present

## 2020-12-20 MED ORDER — TECHNETIUM TC 99M MEBROFENIN IV KIT
5.0000 | PACK | Freq: Once | INTRAVENOUS | Status: AC | PRN
  Administered 2020-12-20: 5 via INTRAVENOUS

## 2020-12-23 ENCOUNTER — Ambulatory Visit: Payer: No Typology Code available for payment source | Admitting: Obstetrics and Gynecology

## 2020-12-26 ENCOUNTER — Encounter (HOSPITAL_COMMUNITY): Payer: No Typology Code available for payment source

## 2020-12-26 ENCOUNTER — Encounter (HOSPITAL_COMMUNITY): Payer: Self-pay

## 2020-12-26 ENCOUNTER — Ambulatory Visit (HOSPITAL_COMMUNITY)
Admission: RE | Admit: 2020-12-26 | Discharge: 2020-12-26 | Disposition: A | Discharge status: Home / Self Care | Payer: No Typology Code available for payment source | Source: Ambulatory Visit | Attending: Family Medicine | Admitting: Family Medicine

## 2020-12-26 ENCOUNTER — Other Ambulatory Visit: Payer: Self-pay

## 2020-12-26 DIAGNOSIS — R110 Nausea: Secondary | ICD-10-CM | POA: Insufficient documentation

## 2020-12-26 DIAGNOSIS — R1013 Epigastric pain: Secondary | ICD-10-CM | POA: Diagnosis present

## 2020-12-26 MED ORDER — TECHNETIUM TC 99M SULFUR COLLOID
2.0000 | Freq: Once | INTRAVENOUS | Status: AC | PRN
  Administered 2020-12-26: 2 via ORAL

## 2020-12-31 ENCOUNTER — Other Ambulatory Visit (HOSPITAL_COMMUNITY)
Admission: RE | Admit: 2020-12-31 | Discharge: 2020-12-31 | Disposition: A | Discharge status: Home / Self Care | Payer: No Typology Code available for payment source | Source: Ambulatory Visit | Attending: Obstetrics & Gynecology | Admitting: Obstetrics & Gynecology

## 2020-12-31 ENCOUNTER — Other Ambulatory Visit: Payer: Self-pay

## 2020-12-31 ENCOUNTER — Encounter (HOSPITAL_BASED_OUTPATIENT_CLINIC_OR_DEPARTMENT_OTHER): Payer: Self-pay | Admitting: Obstetrics & Gynecology

## 2020-12-31 ENCOUNTER — Ambulatory Visit (INDEPENDENT_AMBULATORY_CARE_PROVIDER_SITE_OTHER): Payer: No Typology Code available for payment source | Admitting: Obstetrics & Gynecology

## 2020-12-31 ENCOUNTER — Other Ambulatory Visit (HOSPITAL_BASED_OUTPATIENT_CLINIC_OR_DEPARTMENT_OTHER)
Admission: RE | Admit: 2020-12-31 | Discharge: 2020-12-31 | Disposition: A | Discharge status: Home / Self Care | Payer: No Typology Code available for payment source | Source: Ambulatory Visit | Attending: Obstetrics & Gynecology | Admitting: Obstetrics & Gynecology

## 2020-12-31 VITALS — BP 138/74 | HR 77 | Ht 63.0 in | Wt 184.0 lb

## 2020-12-31 DIAGNOSIS — Z124 Encounter for screening for malignant neoplasm of cervix: Secondary | ICD-10-CM

## 2020-12-31 DIAGNOSIS — I824Y9 Acute embolism and thrombosis of unspecified deep veins of unspecified proximal lower extremity: Secondary | ICD-10-CM

## 2020-12-31 DIAGNOSIS — N912 Amenorrhea, unspecified: Secondary | ICD-10-CM | POA: Diagnosis present

## 2020-12-31 DIAGNOSIS — R935 Abnormal findings on diagnostic imaging of other abdominal regions, including retroperitoneum: Secondary | ICD-10-CM

## 2020-12-31 DIAGNOSIS — R3989 Other symptoms and signs involving the genitourinary system: Secondary | ICD-10-CM | POA: Diagnosis not present

## 2020-12-31 DIAGNOSIS — R350 Frequency of micturition: Secondary | ICD-10-CM

## 2020-12-31 DIAGNOSIS — Z1231 Encounter for screening mammogram for malignant neoplasm of breast: Secondary | ICD-10-CM

## 2020-12-31 NOTE — Progress Notes (Signed)
55 y.o. G85P2002 Married White or Caucasian female here for new patient exam.  Referred from Dr. Florian Buff.  H/o DVT in the past.  Was using vaginal estrogen cream.  Dr. Florian Buff recommended she stop this.  H/o bladder spasms.  Were worse earlier in the year.  Pt feels this may have been due to daughter getting married.  Has stopped oxybutynin and now having more symptoms.  She's not sure she has refills but rx does have refills so likely she does.  Feels like has pressure and fullness in pelvis.  Most recent CT showed mildly irregular endometrium.  Having issues with RLQ pain after she eats.  This has been occurring for months.  Has so much pain, she is using leftover vicodin to eat.  Only thing that doesn't cause pain is eating yogurt.  Getting very frustrated with evaluation.  She has undergone two CT scans, HIDA scan, and emptying study.  Seeing Dr. Marca Ancona at [facility].  Has endoscopy and colonoscopy scheduled.  Pain is typically on left side, mid to upper quadrant.  Not classic location for gall bladder pain but this was the working diagnosis until above testing was normal.    Has not had a cycle since January.    Has colonoscopy and endoscopy scheduled in July.  Tearful today with visit.  No LMP recorded (lmp unknown). Patient is perimenopausal.          Sexually active: Yes.    Smoker:  no  Health Maintenance: Pap:  Needs updated today MMG:  Due, needs scheduling Colonoscopy:  Scheduled in July with Dr. Marca Ancona at [facility]:   Not indicated TDaP:  2019 Pneumonia vaccine(s):  Not indicated Shingrix:   Not indicated   reports that she has never smoked. She has never used smokeless tobacco. She reports current alcohol use. She reports that she does not use drugs.  Past Medical History:  Diagnosis Date  . Blood clot in vein   . Dyspnea   . Lipoedema   . Palpitations   . Thyroid disease    hypothyroidism    Past Surgical History:  Procedure Laterality Date  . LIPOSUCTION EXTREMITIES   02/2017   3 total    Current Outpatient Medications  Medication Sig Dispense Refill  . FERREX 150 150 MG capsule Take 1 tablet by mouth daily.    Marland Kitchen liothyronine (CYTOMEL) 5 MCG tablet Take 5 mcg by mouth daily.    . ondansetron (ZOFRAN-ODT) 4 MG disintegrating tablet Take 4 mg by mouth 3 (three) times daily as needed for nausea or vomiting.    . pantoprazole (PROTONIX) 40 MG tablet Take 40 mg by mouth 2 (two) times daily.    Marland Kitchen thyroid (ARMOUR) 90 MG tablet Take 90 mg by mouth daily.    . valACYclovir (VALTREX) 1000 MG tablet Take 1,000 mg by mouth daily.    Marland Kitchen ascorbic acid (VITAMIN C) 500 MG/5ML syrup Take 500 mg by mouth daily. (Patient not taking: Reported on 12/31/2020)    . diazepam (VALIUM) 5 MG tablet Place 1 tablet vaginally nightly as needed for muscle spasm/ pelvic pain. (Patient not taking: No sig reported) 30 tablet 0  . dicyclomine (BENTYL) 20 MG tablet Take 1 tablet (20 mg total) by mouth 2 (two) times daily. (Patient not taking: Reported on 12/31/2020) 20 tablet 0  . Meth-Hyo-M Bl-Na Phos-Ph Sal (URIBEL) 118 MG CAPS Take 1 capsule (118 mg total) by mouth 4 (four) times daily as needed (bladder pain). (Patient not taking: No sig  reported) 30 capsule 2   Current Facility-Administered Medications  Medication Dose Route Frequency Provider Last Rate Last Admin  . apixaban (ELIQUIS) tablet 2.5 mg  2.5 mg Oral BID Toniann Fail, NP        Family History  Problem Relation Age of Onset  . Thyroid disease Mother   . Breast cancer Maternal Grandmother     Review of Systems  Constitutional: Negative.   Respiratory: Negative.   Cardiovascular: Negative.   Gastrointestinal:       Post prandial abdominal right sided pain  Genitourinary: Negative for urgency and vaginal bleeding.       Pelvic fullness sensation    Exam:   BP 138/74   Pulse 77   Ht 5\' 3"  (1.6 m)   Wt 184 lb (83.5 kg)   LMP  (LMP Unknown)   BMI 32.59 kg/m   Height: 5\' 3"  (160 cm)  General appearance:  alert, cooperative and appears stated age Head: Normocephalic, without obvious abnormality, atraumatic Neck: no adenopathy, supple, symmetrical, trachea midline and thyroid normal to inspection and palpation Lungs: clear to auscultation bilaterally Breasts: normal appearance, no masses or tenderness Heart: regular rate and rhythm Abdomen: soft, non-tender; bowel sounds normal; no masses,  no organomegaly Extremities: extremities normal, atraumatic, no cyanosis or edema Skin: Skin color, texture, turgor normal. No rashes or lesions Lymph nodes: Cervical, supraclavicular, and axillary nodes normal. No abnormal inguinal nodes palpated Neurologic: Grossly normal   Pelvic: External genitalia:  no lesions              Urethra:  normal appearing urethra with no masses, tenderness or lesions              Bartholins and Skenes: normal                 Vagina: normal appearing vagina with normal color and no discharge, no lesions, mild cystocele and mild uterine prolapse              Cervix: no lesions              Pap taken: Yes.   Bimanual Exam:  Uterus:  normal size, contour, position, consistency, mobility, non-tender              Adnexa: normal adnexa and no mass, fullness, tenderness               Rectovaginal: Confirms               Anus:  normal sphincter tone, no lesions  Chaperone, , CMA, was present for exam.  Assessment/Plan: 1. Amenorrhea - FSH; Future - Estradiol; Future  2. Bladder pain - encouraged pt to restart medications that were prescribed by Dr.  3. Frequency of urination  4. Abnormal ultrasound of endometrium - h/o recent vaginal estrogen use - [employer] PELVIC COMPLETE WITH TRANSVAGINAL; Future  5. Deep vein thrombosis (DVT) of proximal lower extremity, unspecified chronicity, unspecified laterality (HCC) - advised pt not to use any future estrogens due to risk for recurrent DVT  6. Cervical cancer screening - Cytology - PAP( [place])  7.  Encounter for screening mammogram for malignant neoplasm of breast - MM 3D SCREEN BREAST BILATERAL; Future  8.  Post prandial pain - encouraged pt to call weekly so see if endoscopy and colonoscopy can be done sooner.  If this is all negative, will refer to [facility] for additional evaluation.

## 2021-01-01 LAB — ESTRADIOL: Estradiol: 51.6 pg/mL

## 2021-01-01 LAB — FOLLICLE STIMULATING HORMONE: FSH: 32.7 m[IU]/mL

## 2021-01-02 ENCOUNTER — Encounter (HOSPITAL_BASED_OUTPATIENT_CLINIC_OR_DEPARTMENT_OTHER): Payer: Self-pay | Admitting: Obstetrics & Gynecology

## 2021-01-02 ENCOUNTER — Other Ambulatory Visit: Payer: Self-pay

## 2021-01-02 ENCOUNTER — Ambulatory Visit (HOSPITAL_BASED_OUTPATIENT_CLINIC_OR_DEPARTMENT_OTHER)
Admission: RE | Admit: 2021-01-02 | Discharge: 2021-01-02 | Disposition: A | Discharge status: Home / Self Care | Payer: No Typology Code available for payment source | Source: Ambulatory Visit | Attending: Obstetrics & Gynecology | Admitting: Obstetrics & Gynecology

## 2021-01-02 DIAGNOSIS — R0600 Dyspnea, unspecified: Secondary | ICD-10-CM | POA: Insufficient documentation

## 2021-01-02 DIAGNOSIS — I82409 Acute embolism and thrombosis of unspecified deep veins of unspecified lower extremity: Secondary | ICD-10-CM | POA: Insufficient documentation

## 2021-01-02 DIAGNOSIS — Z1231 Encounter for screening mammogram for malignant neoplasm of breast: Secondary | ICD-10-CM | POA: Insufficient documentation

## 2021-01-02 DIAGNOSIS — R935 Abnormal findings on diagnostic imaging of other abdominal regions, including retroperitoneum: Secondary | ICD-10-CM

## 2021-01-02 LAB — CYTOLOGY - PAP
Comment: NEGATIVE
Diagnosis: NEGATIVE
High risk HPV: NEGATIVE

## 2021-01-03 ENCOUNTER — Encounter (HOSPITAL_BASED_OUTPATIENT_CLINIC_OR_DEPARTMENT_OTHER): Payer: Self-pay

## 2021-01-03 ENCOUNTER — Other Ambulatory Visit (HOSPITAL_BASED_OUTPATIENT_CLINIC_OR_DEPARTMENT_OTHER): Payer: Self-pay | Admitting: Obstetrics & Gynecology

## 2021-01-03 MED ORDER — MEDROXYPROGESTERONE ACETATE 10 MG PO TABS: 10.0000 mg | ORAL_TABLET | Freq: Every day | ORAL | 0 refills | Status: DC

## 2021-01-03 MED ORDER — SULFAMETHOXAZOLE-TRIMETHOPRIM 800-160 MG PO TABS: 1.0000 | ORAL_TABLET | Freq: Two times a day (BID) | ORAL | 0 refills | Status: DC

## 2021-01-04 ENCOUNTER — Other Ambulatory Visit (HOSPITAL_BASED_OUTPATIENT_CLINIC_OR_DEPARTMENT_OTHER): Payer: Self-pay | Admitting: Obstetrics & Gynecology

## 2021-01-04 MED ORDER — OXYBUTYNIN CHLORIDE ER 5 MG PO TB24: 5.0000 mg | ORAL_TABLET | Freq: Every day | ORAL | 0 refills | Status: DC

## 2021-01-04 NOTE — Progress Notes (Signed)
rx for oxybutynin 5mg  added to medication list.

## 2021-01-07 ENCOUNTER — Other Ambulatory Visit: Payer: Self-pay | Admitting: Obstetrics & Gynecology

## 2021-01-07 DIAGNOSIS — R928 Other abnormal and inconclusive findings on diagnostic imaging of breast: Secondary | ICD-10-CM

## 2021-01-29 ENCOUNTER — Other Ambulatory Visit: Payer: No Typology Code available for payment source

## 2021-02-05 ENCOUNTER — Other Ambulatory Visit: Payer: Self-pay

## 2021-02-05 ENCOUNTER — Ambulatory Visit
Admission: RE | Admit: 2021-02-05 | Discharge: 2021-02-05 | Disposition: A | Discharge status: Home / Self Care | Payer: No Typology Code available for payment source | Source: Ambulatory Visit | Attending: Obstetrics & Gynecology | Admitting: Obstetrics & Gynecology

## 2021-02-05 DIAGNOSIS — R928 Other abnormal and inconclusive findings on diagnostic imaging of breast: Secondary | ICD-10-CM

## 2021-03-02 NOTE — Progress Notes (Deleted)
[facility] Return Visit  SUBJECTIVE  History of Present Illness: Tess Potts is a 55 y.o. female seen in follow-up for bladder pain and levator spasm. Plan at last visit was to use uribel and L-arginine. Vaginal valium was also prescribed for levator pain.     Past Medical History: Patient  has a past medical history of DVT of leg (deep venous thrombosis) (HCC), Dyspnea, Hypothyroidism, Lipoedema, and Palpitations.   Past Surgical History: She  has a past surgical history that includes Liposuction extremities (02/2017).   Medications: She has a current medication list which includes the following prescription(s): ascorbic acid, diazepam, dicyclomine, ferrex 150, liothyronine, medroxyprogesterone, uribel, ondansetron, oxybutynin, pantoprazole, sulfamethoxazole-trimethoprim, thyroid, and valacyclovir, and the following Facility-Administered Medications: apixaban.   Allergies: Patient has No Known Allergies.   Social History: Patient  reports that she has never smoked. She has never used smokeless tobacco. She reports current alcohol use. She reports that she does not use drugs.      OBJECTIVE     Physical Exam: There were no vitals filed for this visit. Gen: No apparent distress, A&O x 3.  Detailed Urogynecologic Evaluation:  Deferred. Prior exam showed:  No flowsheet data found.     ASSESSMENT AND PLAN    Ms. Scarfo is a 55 y.o. with:  No diagnosis found.

## 2021-03-03 ENCOUNTER — Ambulatory Visit: Payer: No Typology Code available for payment source | Admitting: Obstetrics and Gynecology

## 2021-03-06 ENCOUNTER — Ambulatory Visit (HOSPITAL_BASED_OUTPATIENT_CLINIC_OR_DEPARTMENT_OTHER): Payer: No Typology Code available for payment source | Admitting: Obstetrics & Gynecology

## 2021-07-30 IMAGING — MG MM DIGITAL SCREENING BILAT W/ TOMO AND CAD
8 series · 8 of 24 positions shown · non-contrast
Comparison: Previous exams.

CLINICAL DATA: Screening.

EXAM:
DIGITAL SCREENING BILATERAL MAMMOGRAM WITH TOMOSYNTHESIS AND CAD
TECHNIQUE: Bilateral screening digital craniocaudal and mediolateral oblique
mammograms were obtained. Bilateral screening digital breast
tomosynthesis was performed. The images were evaluated with
computer-aided detection.

[R MLO synth-2D]
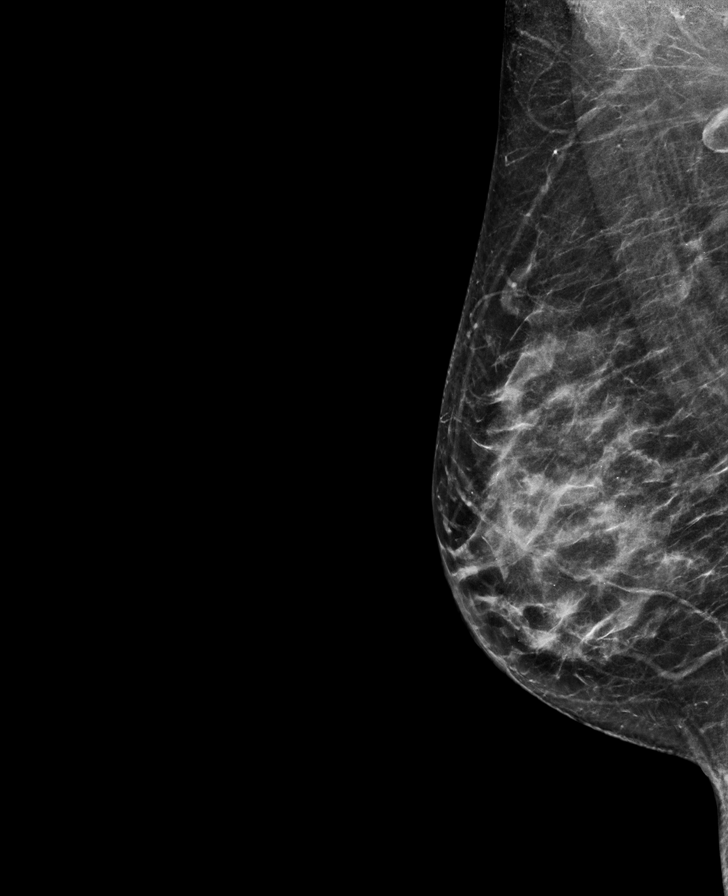

[R CC synth-2D]
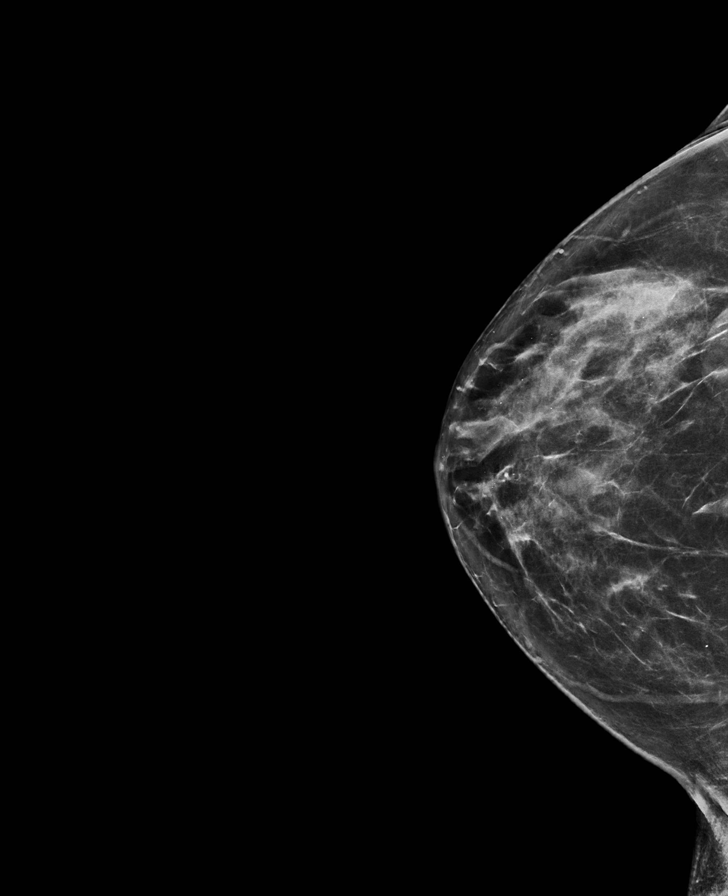

[L MLO synth-2D]
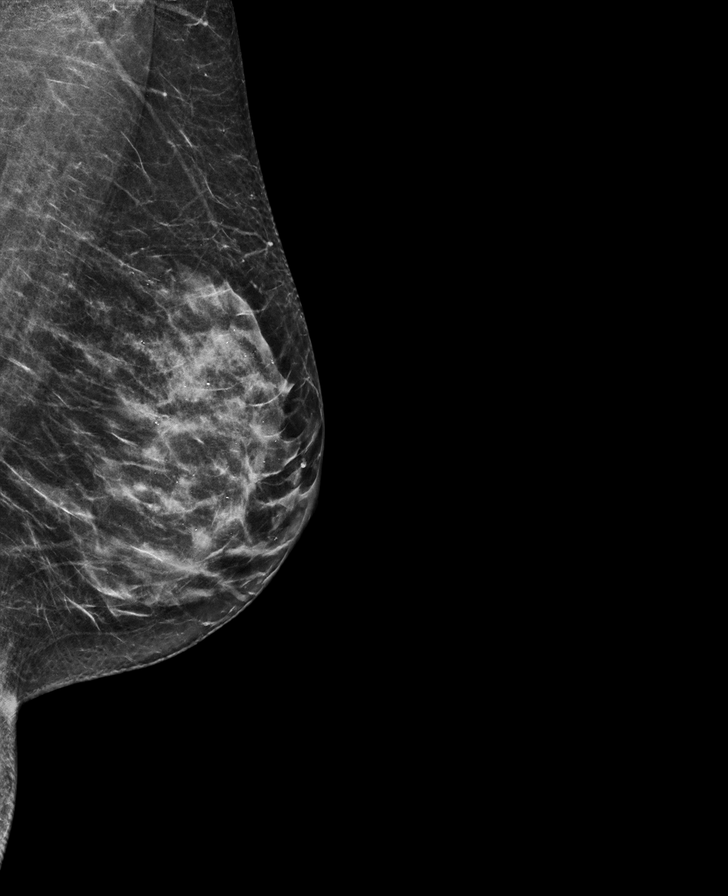

[L CC synth-2D]
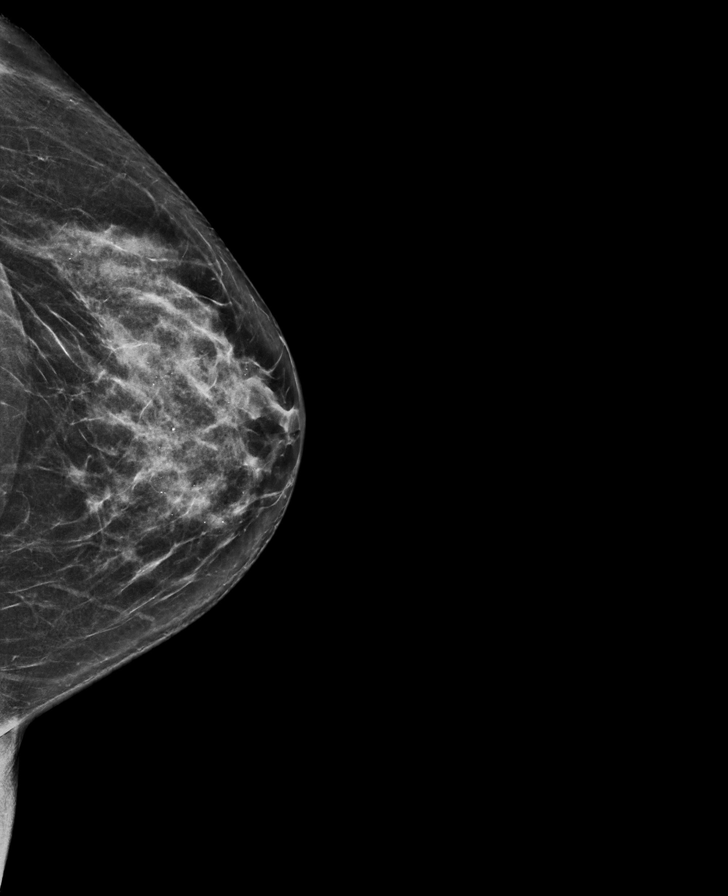

[R MLO tomo · tomo slice 35/68.0]
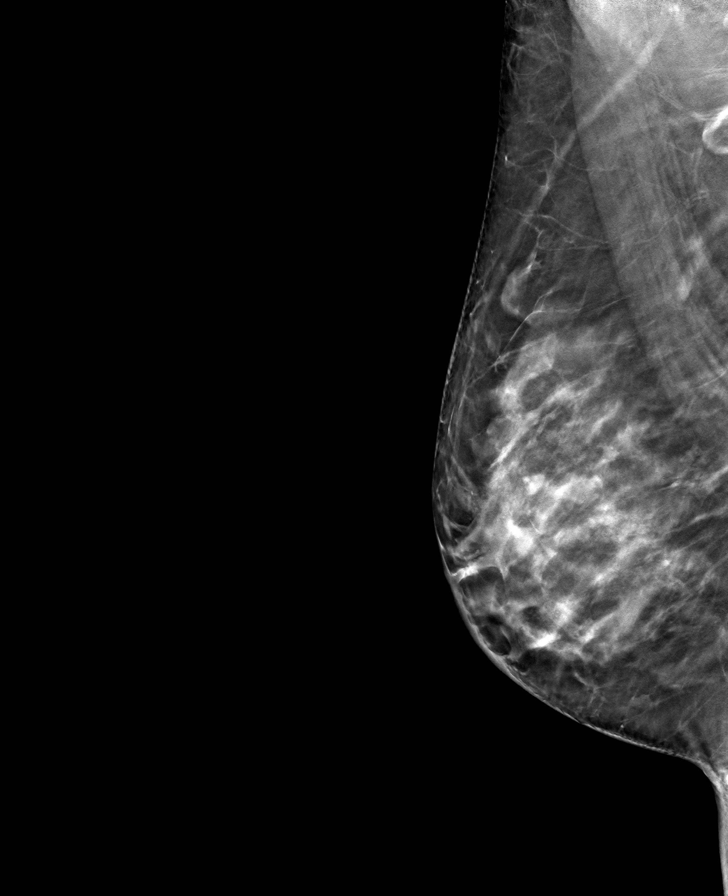

[R CC tomo · tomo slice 38/75.0]
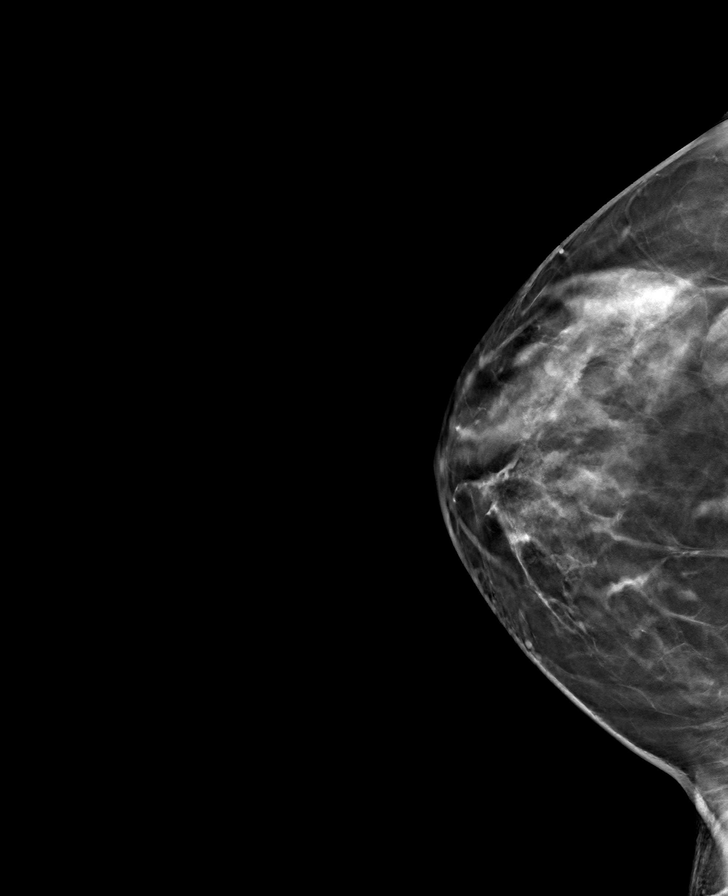

[L CC tomo · tomo slice 35/68.0]
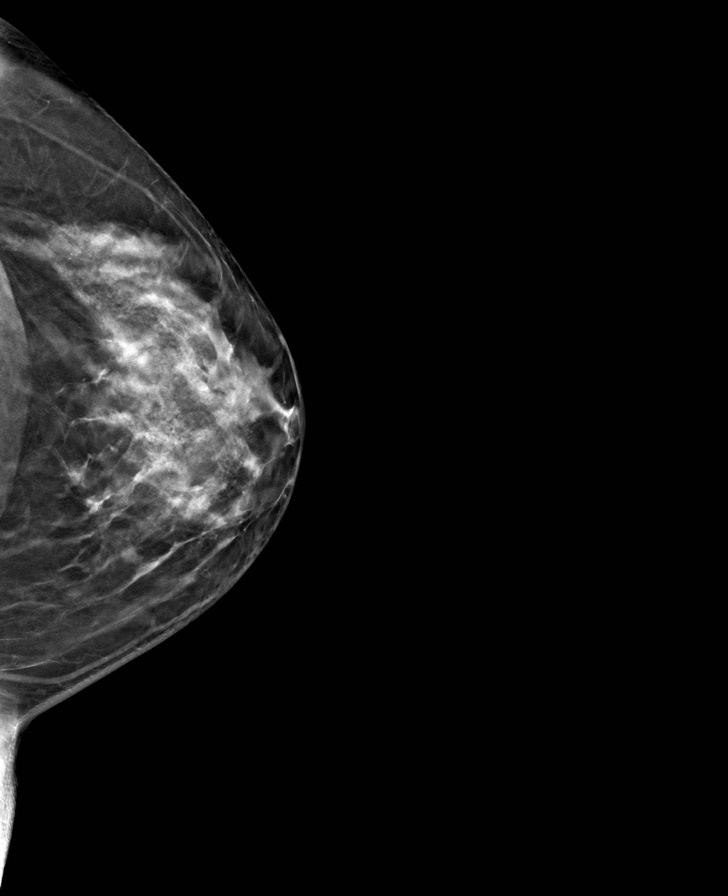

[L MLO tomo · tomo slice 32/63.0]
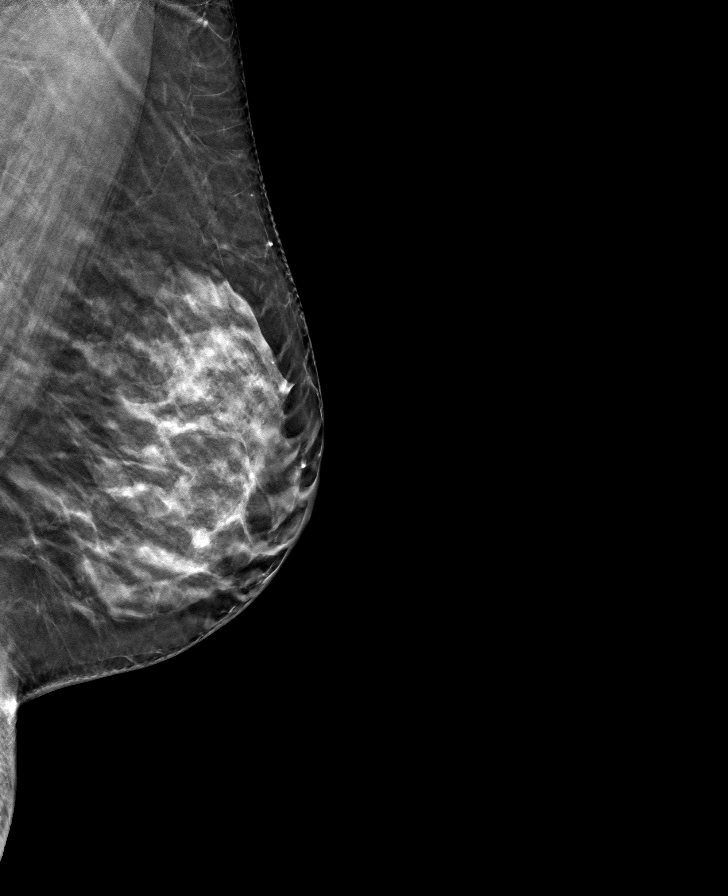

[8 of 24 positions shown; findings below may reference images not displayed]

ACR Breast Density Category c: The breast tissue is heterogeneously
dense, which may obscure small masses.
FINDINGS: In the left breast, a possible asymmetry warrants further
evaluation. In the right breast, no findings suspicious for
malignancy.
IMPRESSION: Further evaluation is suggested for possible asymmetry in the left
breast.

RECOMMENDATION:
Diagnostic mammogram and possibly ultrasound of the left breast.
(Code:[id number])

The patient will be contacted regarding the findings, and additional
imaging will be scheduled.

BI-RADS CATEGORY  0: Incomplete. Need additional imaging evaluation
and/or prior mammograms for comparison.

## 2021-10-17 ENCOUNTER — Other Ambulatory Visit: Payer: Self-pay | Admitting: Student

## 2021-10-17 DIAGNOSIS — Z01818 Encounter for other preprocedural examination: Secondary | ICD-10-CM

## 2021-10-21 ENCOUNTER — Ambulatory Visit: Payer: No Typology Code available for payment source | Admitting: Student

## 2021-10-25 LAB — CBC WITH DIFFERENTIAL
Basophils Absolute: 0.1 10*3/uL (ref 0.0–0.2)
Basos: 1 %
EOS (ABSOLUTE): 0.1 10*3/uL (ref 0.0–0.4)
Eos: 1 %
Hematocrit: 44.6 % (ref 34.0–46.6)
Hemoglobin: 16.1 g/dL — ABNORMAL HIGH (ref 11.1–15.9)
Immature Grans (Abs): 0.1 10*3/uL (ref 0.0–0.1)
Immature Granulocytes: 1 %
Lymphocytes Absolute: 1.3 10*3/uL (ref 0.7–3.1)
Lymphs: 22 %
MCH: 31.8 pg (ref 26.6–33.0)
MCHC: 36.1 g/dL — ABNORMAL HIGH (ref 31.5–35.7)
MCV: 88 fL (ref 79–97)
Monocytes Absolute: 0.5 10*3/uL (ref 0.1–0.9)
Monocytes: 9 %
Neutrophils Absolute: 3.8 10*3/uL (ref 1.4–7.0)
Neutrophils: 66 %
RBC: 5.07 x10E6/uL (ref 3.77–5.28)
RDW: 13 % (ref 11.7–15.4)
WBC: 5.8 10*3/uL (ref 3.4–10.8)

## 2021-10-25 LAB — CMP14+EGFR
ALT: 32 IU/L (ref 0–32)
AST: 22 IU/L (ref 0–40)
Albumin/Globulin Ratio: 1.9 (ref 1.2–2.2)
Albumin: 4.6 g/dL (ref 3.8–4.9)
Alkaline Phosphatase: 84 IU/L (ref 44–121)
BUN/Creatinine Ratio: 31 — ABNORMAL HIGH (ref 9–23)
BUN: 22 mg/dL (ref 6–24)
Bilirubin Total: 0.3 mg/dL (ref 0.0–1.2)
CO2: 20 mmol/L (ref 20–29)
Calcium: 9.6 mg/dL (ref 8.7–10.2)
Chloride: 103 mmol/L (ref 96–106)
Creatinine, Ser: 0.72 mg/dL (ref 0.57–1.00)
Globulin, Total: 2.4 g/dL (ref 1.5–4.5)
Glucose: 100 mg/dL — ABNORMAL HIGH (ref 70–99)
Potassium: 4.6 mmol/L (ref 3.5–5.2)
Sodium: 140 mmol/L (ref 134–144)
Total Protein: 7 g/dL (ref 6.0–8.5)
eGFR: 99 mL/min/{1.73_m2} (ref >59)

## 2021-10-25 LAB — PT AND PTT
INR: 0.9 (ref 0.9–1.2)
Prothrombin Time: 10 s (ref 9.1–12.0)
aPTT: 30 s (ref 24–33)

## 2021-10-25 LAB — BETA HCG QUANT (REF LAB): hCG Quant: 1 m[IU]/mL

## 2021-10-28 NOTE — Progress Notes (Signed)
? ?Primary Physician/Referring:  Bryson Corona, NP ? ?Patient ID: Brandy Dixon, female    DOB: 08-02-1966, 56 y.o.   MRN: [id number] ? ?Chief Complaint  ?Patient presents with  ? New Patient (Initial Visit)  ? Follow-up  ? ?HPI:   ? ?Brandy Dixon  is a 56 y.o. 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  She is currently scheduled to have repeat lymphatic preserved liposuction by Dr. Tessa Lerner in [place], she presents to our office for preoperative risk stratification.  Patient notably also has recently been diagnosed with hip dysplasia, which limits her physical activity some. ? ?Denies history of hypertension, hyperlipidemia, diabetes, tobacco use.  Patient does follow regularly with PCP. ? ?Patient does remain active riding a stationary bike for 30 minutes to an hour 3 to 5 days/week without issue.  She walks regularly and is fairly asymptomatic with the exception of hip pain.  Denies chest pain, dyspnea. ? ?Past Medical History:  ?Diagnosis Date  ? DVT of leg (deep venous thrombosis) ([place])   ? Dyspnea   ? Hypothyroidism   ? Lipoedema   ? Palpitations   ? ?Past Surgical History:  ?Procedure Laterality Date  ? LIPOSUCTION EXTREMITIES  02/2017  ? 3 total  ? ?Family History  ?Problem Relation Age of Onset  ? Thyroid disease Mother   ? Breast cancer Maternal Grandmother   ?  ?Social History  ? ?Tobacco Use  ? Smoking status: Never  ? Smokeless tobacco: Never  ?Substance Use Topics  ? Alcohol use: Yes  ?  Comment: socially  ? ?Marital Status: Married  ? ?ROS  ?Review of Systems  ?Constitutional: Negative for malaise/fatigue and weight gain.  ?Cardiovascular:  Negative for chest pain, claudication, leg swelling, near-syncope, orthopnea, palpitations, paroxysmal nocturnal dyspnea and syncope.  ?Respiratory:  Negative for shortness of breath.   ?Musculoskeletal:  Positive for joint pain (hip).   ?Neurological:  Negative for dizziness.  ? ?Objective  ?Blood pressure (!) 146/87, pulse 97, temperature 98.4 ?F (36.9 ?C), temperature source Temporal, resp. rate 17, height 5\' 3"  (1.6 m), weight 202 lb (91.6 kg), SpO2 94 %.  ?Vitals with BMI 10/29/2021 10/29/2021 12/31/2020  ?Height - 5\' 3"  5\' 3"   ?Weight - 202 lbs 184 lbs  ?BMI - 35.79 32.6  ?Systolic [id number]  ?Diastolic 87 85 74  ?Pulse 97 97 77  ?  ? ? Physical Exam ?Vitals reviewed.  ?Constitutional:   ?   Appearance: She is obese.  ?Cardiovascular:  ?   Rate and Rhythm: Normal rate and regular rhythm.  ?   Pulses: Intact distal pulses.     ?     Carotid pulses are 2+ on the right side and 2+ on the left side. ?     Radial pulses are 2+ on the right side and 2+ on the left side.  ?     Dorsalis pedis pulses are 2+ on the right side and 2+ on the left side.  ?     Posterior tibial pulses are 2+ on the right side and 2+ on the left side.  ?   Heart sounds: S1 normal and S2 normal. No murmur heard. ?  No gallop.  ?Pulmonary:  ?   Effort: Pulmonary effort is normal. No respiratory distress.  ?   Breath sounds: No wheezing, rhonchi or rales.  ?Musculoskeletal:  ?   Right lower leg: No edema.  ?  Left lower leg: No edema.  ?Skin: ?   Comments: Surgical scars bilateral legs and arms.  ?Neurological:  ?   Mental Status: She is alert.  ? ? ?Laboratory examination:  ? ?Recent Labs  ?  11/15/20 ?1800 10/24/21 ?1140  ?NA 138 140  ?K 4.1 4.6  ?CL 105 103  ?CO2 24 20  ?GLUCOSE 107* 100*  ?BUN 11 22  ?CREATININE 0.76 0.72  ?CALCIUM 9.7 9.6  ?GFRNONAA >60  --   ? ?estimated creatinine clearance is 86.4 mL/min (by C-G formula based on SCr of 0.72 mg/dL).  ?CMP Latest Ref Rng & Units 10/24/2021 11/15/2020 09/29/2018  ?Glucose 70 - 99 mg/dL 100(H) 107(H) 100(H)  ?BUN 6 - 24 mg/dL 22 11 12   ?Creatinine 0.57 - 1.00 mg/dL 0.72 0.76 0.62  ?Sodium 134 - 144 mmol/L 140 138 140  ?Potassium 3.5 - 5.2 mmol/L 4.6 4.1 4.4  ?Chloride 96 - 106 mmol/L 103 105 106  ?CO2 20 - 29 mmol/L 20 24 20    ?Calcium 8.7 - 10.2 mg/dL 9.6 9.7 9.5  ?Total Protein 6.0 - 8.5 g/dL 7.0 7.8 -  ?Total Bilirubin 0.0 - 1.2 mg/dL 0.3 1.0 -  ?Alkaline Phos 44 - 121 IU/L 84 51 -  ?AST 0 - 40 IU/L 22 23 -  ?ALT 0 - 32 IU/L 32 32 -  ? ?CBC Latest Ref Rng & Units 10/24/2021 11/15/2020 09/29/2018  ?WBC 3.4 - 10.8 x10E3/uL 5.8 5.9 6.1  ?Hemoglobin 11.1 - 15.9 g/dL 16.1(H) 15.1(H) 14.4  ?Hematocrit 34.0 - 46.6 % 44.6 43.4 43.1  ?Platelets 150 - 400 K/uL - 276 268  ? ? ?Lipid Panel ?No results for input(s): CHOL, TRIG, LDLCALC, VLDL, HDL, CHOLHDL, LDLDIRECT in the last 8760 hours. ? ?HEMOGLOBIN A1C ?No results found for: HGBA1C, MPG ?TSH ?No results for input(s): TSH in the last 8760 hours. ? ?External labs:  ?None ? ?Allergies  ?No Known Allergies  ? ?Medications Prior to Visit:  ? ?Outpatient Medications Prior to Visit  ?Medication Sig Dispense Refill  ? ascorbic acid (VITAMIN C) 500 MG/5ML syrup Take 500 mg by mouth daily.    ? carbamazepine (TEGRETOL) 200 MG tablet Take 1 tablet by mouth as needed.    ? FERREX 150 150 MG capsule Take 1 tablet by mouth daily.    ? ondansetron (ZOFRAN-ODT) 4 MG disintegrating tablet Take 4 mg by mouth 3 (three) times daily as needed for nausea or vomiting.    ? oxybutynin (DITROPAN-XL) 5 MG 24 hr tablet Take 1 tablet (5 mg total) by mouth at bedtime. (Patient taking differently: Take 5 mg by mouth as needed.) 30 tablet 0  ? pantoprazole (PROTONIX) 40 MG tablet Take 40 mg by mouth 2 (two) times daily.    ? pregabalin (LYRICA) 25 MG capsule Take 25 mg by mouth at bedtime.    ? thyroid (ARMOUR) 90 MG tablet Take 90 mg by mouth daily.    ? UBRELVY 100 MG TABS Take 1 tablet by mouth as needed.    ? valACYclovir (VALTREX) 1000 MG tablet Take 1,000 mg by mouth as needed.    ? diazepam (VALIUM) 5 MG tablet Place 1 tablet vaginally nightly as needed for muscle spasm/ pelvic pain. 30 tablet 0  ? dicyclomine (BENTYL) 20 MG tablet Take 1 tablet (20 mg total) by mouth 2 (two) times daily. 20 tablet 0  ? liothyronine  (CYTOMEL) 5 MCG tablet Take 5 mcg by mouth daily.    ? medroxyPROGESTERone (PROVERA) 10 MG tablet Take  1 tablet (10 mg total) by mouth daily. Take for 10 days. 10 tablet 0  ? Meth-Hyo-M Bl-Na Phos-Ph Sal (URIBEL) 118 MG CAPS Take 1 capsule (118 mg total) by mouth 4 (four) times daily as needed (bladder pain). 30 capsule 2  ? sulfamethoxazole-trimethoprim (BACTRIM DS) 800-160 MG tablet Take 1 tablet by mouth 2 (two) times daily. 6 tablet 0  ? ?Facility-Administered Medications Prior to Visit  ?Medication Dose Route Frequency Provider Last Rate Last Admin  ? apixaban (ELIQUIS) tablet 2.5 mg  2.5 mg Oral BID Miquel Dunn, NP      ? ?Final Medications at End of Visit   ? ?Current Meds  ?Medication Sig  ? ascorbic acid (VITAMIN C) 500 MG/5ML syrup Take 500 mg by mouth daily.  ? carbamazepine (TEGRETOL) 200 MG tablet Take 1 tablet by mouth as needed.  ? FERREX 150 150 MG capsule Take 1 tablet by mouth daily.  ? ondansetron (ZOFRAN-ODT) 4 MG disintegrating tablet Take 4 mg by mouth 3 (three) times daily as needed for nausea or vomiting.  ? oxybutynin (DITROPAN-XL) 5 MG 24 hr tablet Take 1 tablet (5 mg total) by mouth at bedtime. (Patient taking differently: Take 5 mg by mouth as needed.)  ? pantoprazole (PROTONIX) 40 MG tablet Take 40 mg by mouth 2 (two) times daily.  ? pregabalin (LYRICA) 25 MG capsule Take 25 mg by mouth at bedtime.  ? thyroid (ARMOUR) 90 MG tablet Take 90 mg by mouth daily.  ? UBRELVY 100 MG TABS Take 1 tablet by mouth as needed.  ? valACYclovir (VALTREX) 1000 MG tablet Take 1,000 mg by mouth as needed.  ? ?Current Facility-Administered Medications for the 10/29/21 encounter (Office Visit) with Alethia Berthold, PA-C  ?Medication  ? apixaban (ELIQUIS) tablet 2.5 mg  ? ?Radiology:  ? ?No results found. ? ?Cardiac Studies:  ? ?Echocardiogram 02/02/2017: ?Left ventricle cavity is normal in size. Normal global wall motion. Normal diastolic filling pattern, normal LAP. Calculated EF 55%. ?Mild  (Grade I) mitral regurgitation. ?Mild tricuspid regurgitation. No evidence of pulmonary hypertension. ?Trace pulmonic regurgitation. ?  ?Treadmill exercise stress test 01/29/2017: ?Indication: Palpitations, SoB ?

## 2021-10-29 ENCOUNTER — Other Ambulatory Visit: Payer: Self-pay

## 2021-10-29 ENCOUNTER — Encounter: Payer: Self-pay | Admitting: Student

## 2021-10-29 ENCOUNTER — Ambulatory Visit: Payer: No Typology Code available for payment source | Admitting: Student

## 2021-10-29 VITALS — BP 146/87 | HR 97 | Temp 98.4°F | Resp 17 | Ht 63.0 in | Wt 202.0 lb

## 2021-10-29 DIAGNOSIS — Z01810 Encounter for preprocedural cardiovascular examination: Secondary | ICD-10-CM

## 2022-02-03 ENCOUNTER — Other Ambulatory Visit: Payer: Self-pay | Admitting: Orthopedic Surgery

## 2022-02-03 DIAGNOSIS — M1611 Unilateral primary osteoarthritis, right hip: Secondary | ICD-10-CM

## 2022-02-05 ENCOUNTER — Ambulatory Visit
Admission: RE | Admit: 2022-02-05 | Discharge: 2022-02-05 | Disposition: A | Discharge status: Home / Self Care | Payer: 59 | Source: Ambulatory Visit | Attending: Orthopedic Surgery | Admitting: Orthopedic Surgery

## 2022-02-05 DIAGNOSIS — M1611 Unilateral primary osteoarthritis, right hip: Secondary | ICD-10-CM

## 2022-02-05 MED ORDER — METHYLPREDNISOLONE ACETATE 40 MG/ML INJ SUSP (RADIOLOG
80.0000 mg | Freq: Once | INTRAMUSCULAR | Status: AC
  Administered 2022-02-05: 80 mg via INTRA_ARTICULAR

## 2022-02-05 MED ORDER — IOPAMIDOL (ISOVUE-M 200) INJECTION 41%
1.0000 mL | Freq: Once | INTRAMUSCULAR | Status: AC
  Administered 2022-02-05: 1 mL via INTRA_ARTICULAR

## 2022-09-02 IMAGING — XA DG FLUORO GUIDE NDL PLC/BX
1 series · 1 of 1 positions shown · non-contrast
Comparison: None Available.

CLINICAL DATA: Primary osteoarthritis of right hip. Right hip pain.

EXAM:
RIGHT HIP INJECTION UNDER FLUOROSCOPY

[Series 1: ortho standard · 1 of 1 slices shown]
[im 1/1]
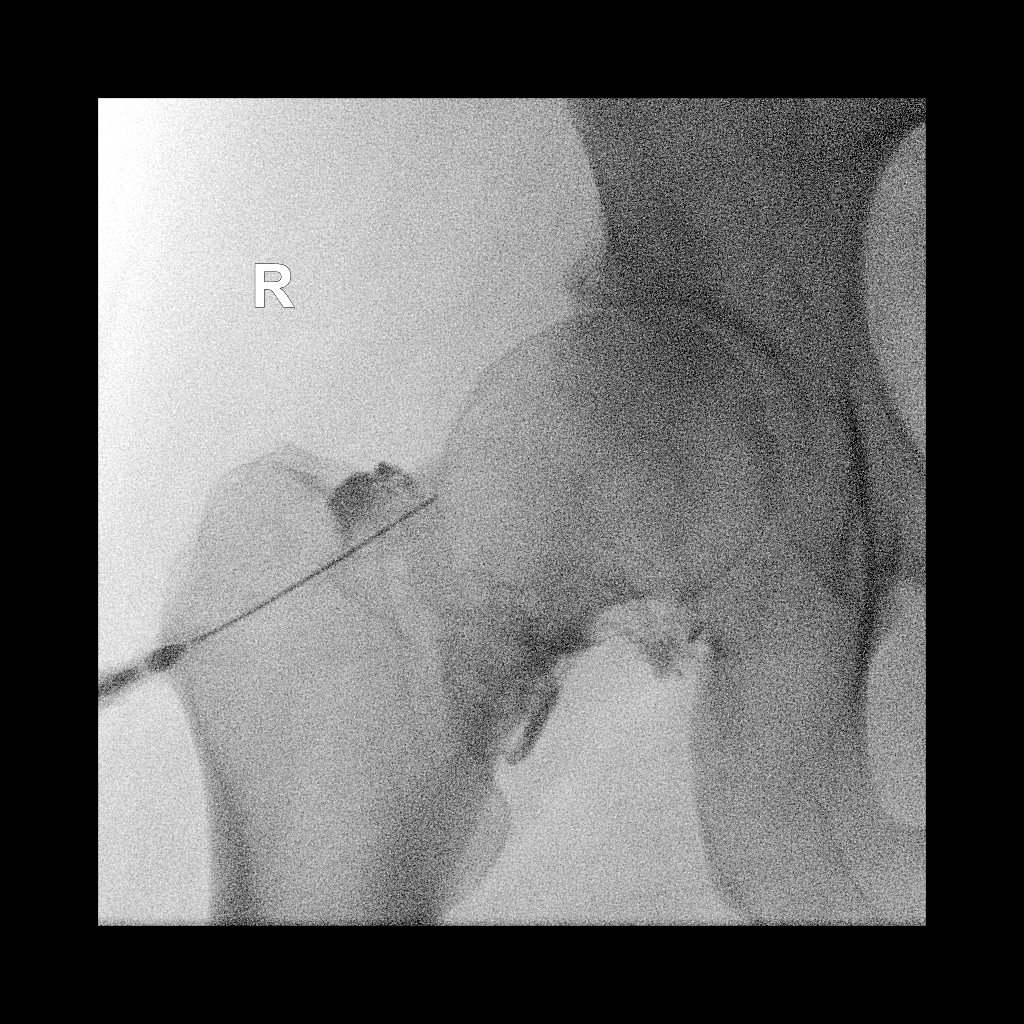

[1 of 1 positions shown; findings below may reference images not displayed]

FLUOROSCOPY:
Radiation Exposure Index (as provided by the fluoroscopic device):
0.70 mGy Kerma

PROCEDURE:
The overlying skin was prepped with Betadine, draped in the usual
sterile fashion, and infiltrated locally with 1% lidocaine. A
inch 22 gauge spinal needle was advanced to the lateral aspect of
the right femoral head-neck junction. 1 mL of 1% lidocaine injected
easily. Diagnostic injection of a small amount of Isovue-M 200
demonstrated intra-articular spread without intravascular component.
80 mg of Depo-Medrol and 4 mL of 0.25% bupivacaine were then
administered. The needle was removed and a sterile dressing was
applied. There was no immediate complication.
IMPRESSION: Technically successful right hip injection under fluoroscopy.

## 2022-09-04 ENCOUNTER — Ambulatory Visit: Payer: 59 | Admitting: Internal Medicine

## 2022-09-04 VITALS — BP 139/82 | HR 87 | Ht 63.0 in | Wt 182.4 lb

## 2022-09-04 DIAGNOSIS — Z01818 Encounter for other preprocedural examination: Secondary | ICD-10-CM

## 2022-09-04 DIAGNOSIS — Z86718 Personal history of other venous thrombosis and embolism: Secondary | ICD-10-CM

## 2022-09-04 NOTE — Progress Notes (Signed)
Primary Physician/Referring:  Bridgette Habermann, NP  Patient ID: Brandy Dixon, female    DOB: 1966/05/30, 57 y.o.   MRN: [id number]  Chief Complaint  Patient presents with   Hyperlipidemia   Medical Clearance    HIP surgery    HPI:    Brandy Dixon  is a 57 y.o. 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  She is currently scheduled to have repeat lymphatic preserved liposuction by Dr. Hermelinda Medicus in [place], she presents to our office for preoperative risk stratification.  Patient notably also has recently been diagnosed with hip dysplasia, which limits her physical activity some.  Patient is here for a follow-up visit for surgical risk stratification prior to hip replacement surgery. Patient does remain active riding a stationary bike for 30 minutes to an hour 3 to 5 days/week without issue.  She walks regularly and is fairly asymptomatic with the exception of hip pain.  Denies chest pain, dyspnea. She can walk up a flight of stairs and around the block without having to stop even though her hip will hurt. She denies palpitations, diaphoresis, syncope, orthopnea, edema, claudication, PND.    Past Medical History:  Diagnosis Date   DVT of leg (deep venous thrombosis) ([facility])    Dyspnea    Hypothyroidism    Lipoedema    Palpitations    Past Surgical History:  Procedure Laterality Date   LIPOSUCTION EXTREMITIES  02/2017   3 total   Family History  Problem Relation Age of Onset   Thyroid disease Mother    Breast cancer Maternal Grandmother     Social History   Tobacco Use   Smoking status: Never   Smokeless tobacco: Never  Substance Use Topics   Alcohol use: Yes    Comment: socially   Marital Status: Married   ROS  Review of Systems  Constitutional: Negative for malaise/fatigue and weight gain.  Cardiovascular:  Negative for chest pain, claudication, leg  swelling, near-syncope, orthopnea, palpitations, paroxysmal nocturnal dyspnea and syncope.  Respiratory:  Negative for shortness of breath.   Musculoskeletal:  Positive for joint pain (hip).  Neurological:  Negative for dizziness.    Objective  Blood pressure 139/82, pulse 87, height 5\' 3"  (1.6 m), weight 182 lb 6.4 oz (82.7 kg), SpO2 97 %.     09/04/2022   11:35 AM 09/04/2022   11:21 AM 10/29/2021   11:25 AM  Vitals with BMI  Height  5\' 3"    Weight  182 lbs 6 oz   BMI  32.32   Systolic 139 152 10/31/2021  Diastolic 82 87 87  Pulse 87 89 97      Physical Exam Vitals reviewed.  Cardiovascular:     Rate and Rhythm: Normal rate and regular rhythm.     Pulses: Intact distal pulses.          Carotid pulses are 2+ on the right side and 2+ on the left side.      Radial pulses are 2+ on the right side and 2+ on the left side.       Dorsalis pedis pulses are 2+ on the right side and 2+ on the left side.       Posterior tibial pulses are 2+ on the right side and 2+ on the left side.     Heart sounds: S1 normal and S2 normal. No murmur heard.    No gallop.  Pulmonary:  Effort: Pulmonary effort is normal. No respiratory distress.     Breath sounds: No wheezing, rhonchi or rales.  Musculoskeletal:     Right lower leg: No edema.     Left lower leg: No edema.  Skin:    General: Skin is warm and dry.  Neurological:     Mental Status: She is alert.     Laboratory examination:   Recent Labs    10/24/21 1140  NA 140  K 4.6  CL 103  CO2 20  GLUCOSE 100*  BUN 22  CREATININE 0.72  CALCIUM 9.6   CrCl cannot be calculated (Patient's most recent lab result is older than the maximum 21 days allowed.).     Latest Ref Rng & Units 10/24/2021   11:40 AM 11/15/2020    6:00 PM 09/29/2018   11:46 AM  CMP  Glucose 70 - 99 mg/dL 100  107  100   BUN 6 - 24 mg/dL 22  11  12    Creatinine 0.57 - 1.00 mg/dL 0.72  0.76  0.62   Sodium 134 - 144 mmol/L 140  138  140   Potassium 3.5 - 5.2 mmol/L 4.6   4.1  4.4   Chloride 96 - 106 mmol/L 103  105  106   CO2 20 - 29 mmol/L 20  24  20    Calcium 8.7 - 10.2 mg/dL 9.6  9.7  9.5   Total Protein 6.0 - 8.5 g/dL 7.0  7.8    Total Bilirubin 0.0 - 1.2 mg/dL 0.3  1.0    Alkaline Phos 44 - 121 IU/L 84  51    AST 0 - 40 IU/L 22  23    ALT 0 - 32 IU/L 32  32        Latest Ref Rng & Units 10/24/2021   11:40 AM 11/15/2020    6:00 PM 09/29/2018   11:46 AM  CBC  WBC 3.4 - 10.8 x10E3/uL 5.8  5.9  6.1   Hemoglobin 11.1 - 15.9 g/dL 16.1  15.1  14.4   Hematocrit 34.0 - 46.6 % 44.6  43.4  43.1   Platelets 150 - 400 K/uL  276  268     Lipid Panel No results for input(s): "CHOL", "TRIG", "[place]", "VLDL", "HDL", "CHOLHDL", "LDLDIRECT" in the last 8760 hours.  HEMOGLOBIN A1C No results found for: "HGBA1C", "MPG" TSH No results for input(s): "TSH" in the last 8760 hours.  External labs:  None  Allergies  No Known Allergies   Medications Prior to Visit:   Outpatient Medications Prior to Visit  Medication Sig Dispense Refill   pantoprazole (PROTONIX) 40 MG tablet Take 40 mg by mouth 2 (two) times daily.     pregabalin (LYRICA) 25 MG capsule Take 25 mg by mouth as needed.     thyroid (ARMOUR) 90 MG tablet Take 90 mg by mouth daily.     UBRELVY 100 MG TABS Take 1 tablet by mouth as needed.     ascorbic acid (VITAMIN C) 500 MG/5ML syrup Take 500 mg by mouth daily. (Patient not taking: Reported on 09/04/2022)     carbamazepine (TEGRETOL) 200 MG tablet Take 1 tablet by mouth as needed. (Patient not taking: Reported on 09/04/2022)     FERREX 150 150 MG capsule Take 1 tablet by mouth daily. (Patient not taking: Reported on 09/04/2022)     ondansetron (ZOFRAN-ODT) 4 MG disintegrating tablet Take 4 mg by mouth 3 (three) times daily as needed for nausea or vomiting. (Patient  not taking: Reported on 09/04/2022)     oxybutynin (DITROPAN-XL) 5 MG 24 hr tablet Take 1 tablet (5 mg total) by mouth at bedtime. (Patient not taking: Reported on 09/04/2022) 30 tablet 0    valACYclovir (VALTREX) 1000 MG tablet Take 1,000 mg by mouth as needed.     Facility-Administered Medications Prior to Visit  Medication Dose Route Frequency Provider Last Rate Last Admin   apixaban (ELIQUIS) tablet 2.5 mg  2.5 mg Oral BID Toniann Fail, NP       Final Medications at End of Visit    Current Meds  Medication Sig   pantoprazole (PROTONIX) 40 MG tablet Take 40 mg by mouth 2 (two) times daily.   pregabalin (LYRICA) 25 MG capsule Take 25 mg by mouth as needed.   thyroid (ARMOUR) 90 MG tablet Take 90 mg by mouth daily.   UBRELVY 100 MG TABS Take 1 tablet by mouth as needed.   Current Facility-Administered Medications for the 09/04/22 encounter (Office Visit) with Clotilde Dieter, DO  Medication   apixaban (ELIQUIS) tablet 2.5 mg   Radiology:   No results found.  Cardiac Studies:   Echocardiogram 02/02/2017: Left ventricle cavity is normal in size. Normal global wall motion. Normal diastolic filling pattern, normal LAP. Calculated EF 55%. Mild (Grade I) mitral regurgitation. Mild tricuspid regurgitation. No evidence of pulmonary hypertension. Trace pulmonic regurgitation.   Treadmill exercise stress test 01/29/2017: Indication: Palpitations, SoB Resting EKG demonstrates NSR. The patient exercised according to [facility] Protocol, Total time recorded 8:29 min achieving max heart rate of 160 which was 93% of THR for age and 10.16 METS of work. Stress terminated due to THR (>85% MPHR)/MPHR met. There was no ST-T changes of ischemia with exercise stress test. There were no significant arrhythmias. Hypertensive BP response, peak BP 240/72 mm Hg. Rec: No e/o ischemia by GXT. Exercise tolerence is normal. Hypertensive BP response, peak BP 240/72 mm Hg. No arrhythmias.Continue Preventive therapy.  EKG:   09/04/2022: normal sinus rhythm, normal axis, no evidence of ischemia. EKG unchanged from prior  10/29/2021: Sinus rhythm at a rate of 83 bpm.  Normal axis.  No evidence of  ischemia or underlying injury pattern.  Unchanged compared to EKG 11/15/2020.  09/29/2018: Normal sinus rhythm at 67 bpm, normal axis, no evidence of ischemia.   Assessment     ICD-10-CM   1. Preoperative examination  Z01.818 EKG 12-Lead    2. History of deep vein thrombosis (DVT) of lower extremity  Z86.718        Medications Discontinued During This Encounter  Medication Reason   valACYclovir (VALTREX) 1000 MG tablet Completed Course    No orders of the defined types were placed in this encounter.   Recommendations:   Brandy Dixon is a 57 y.o. 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  She is currently scheduled to have repeat lymphatic preserved liposuction by Dr. Hermelinda Medicus in [place], she presents to our office for preoperative risk stratification.  Patient notably also has recently been diagnosed with hip dysplasia, which limits her physical activity some.   Preoperative examination EKG is normal and unchanged from her priors Patient reaches at least 4 METs in daily life No CP or SOB with maximum exertion She is able to walk up a flight or stairs and around the block without having to stop Patient is low risk for an intermediate risk surgery. Ok to proceed   History  of deep vein thrombosis (DVT) of lower extremity Patient has history of provoked DVT after a previous surgery I would watch for this potentially occurring again as she has many surgeries   Follow-up in 1 year, sooner if needed, for primary prevention per patient preference.    Brandy Flock, DO, [facility] 09/04/2022, 12:37 PM Office: [phone]

## 2022-10-30 ENCOUNTER — Ambulatory Visit: Payer: 59 | Admitting: Student

## 2023-05-03 ENCOUNTER — Encounter: Payer: Self-pay | Admitting: Cardiology

## 2023-05-03 ENCOUNTER — Ambulatory Visit: Payer: 59 | Admitting: Cardiology

## 2023-05-03 VITALS — BP 159/80 | HR 85 | Resp 16 | Ht 63.0 in | Wt 197.0 lb

## 2023-05-03 DIAGNOSIS — Z01818 Encounter for other preprocedural examination: Secondary | ICD-10-CM

## 2023-05-03 DIAGNOSIS — E7800 Pure hypercholesterolemia, unspecified: Secondary | ICD-10-CM

## 2023-05-03 NOTE — Progress Notes (Unsigned)
Primary Physician/Referring:  Bridgette Habermann, NP  Patient ID: Brandy Dixon, female    DOB: 11/30/1965, 57 y.o.   MRN: [id number]  Chief Complaint  Patient presents with   Preoperative examination   HPI:    Brandy Dixon  is a 57 y.o. 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  She is currently scheduled to have repeat lymphatic preserved liposuction by Dr. Hermelinda Medicus in [place], she presents to our office for preoperative risk stratification.  She underwent right hip replacement by Dr. Lajoyce Corners in March 2024 without periprocedural complications.  She is now scheduled for repeat liposuction and surgery for lipedema in [place] and wanted EKG and wanted to discuss with me regarding preoperative cardiac restratification.  States that she has been very active and has recuperated well from hip surgery.  She remains asymptomatic.  Past Medical History:  Diagnosis Date   DVT of leg (deep venous thrombosis) (HCC)    Dyspnea    Hypothyroidism    Lipoedema    Palpitations    Past Surgical History:  Procedure Laterality Date   LIPOSUCTION EXTREMITIES  02/2017   3 total   Family History  Problem Relation Age of Onset   Thyroid disease Mother    Breast cancer Maternal Grandmother     Social History   Tobacco Use   Smoking status: Never   Smokeless tobacco: Never  Substance Use Topics   Alcohol use: Yes    Comment: socially   Marital Status: Married   ROS  Review of Systems  Cardiovascular:  Negative for chest pain, dyspnea on exertion and leg swelling.    Objective  Blood pressure (!) 159/80, pulse 85, resp. rate 16, height 5\' 3"  (1.6 m), weight 197 lb (89.4 kg), SpO2 97%.     05/03/2023    3:26 PM 09/04/2022   11:35 AM 09/04/2022   11:21 AM  Vitals with BMI  Height 5\' 3"   5\' 3"   Weight 197 lbs  182 lbs 6 oz  BMI 34.91  32.32  Systolic 159 139 086   Diastolic 80 82 87  Pulse 85 87 89      Physical Exam Neck:     Vascular: No carotid bruit or JVD.  Cardiovascular:     Rate and Rhythm: Normal rate and regular rhythm.     Pulses: Intact distal pulses.     Heart sounds: Normal heart sounds. No murmur heard.    No gallop.  Pulmonary:     Effort: Pulmonary effort is normal.     Breath sounds: Normal breath sounds.  Abdominal:     General: Bowel sounds are normal.     Palpations: Abdomen is soft.  Musculoskeletal:     Right lower leg: No edema.     Left lower leg: No edema.     Laboratory examination:   Allergies  No Known Allergies   Medications Prior to Visit:   Outpatient Medications Prior to Visit  Medication Sig Dispense Refill   ascorbic acid (VITAMIN C) 500 MG/5ML syrup Take 500 mg by mouth daily.     carbamazepine (TEGRETOL) 200 MG tablet Take 1 tablet by mouth as needed.     estradiol (ESTRACE) 0.1 MG/GM vaginal cream Place 1 Applicatorful vaginally at bedtime.     oxybutynin (DITROPAN-XL) 5 MG 24 hr tablet Take 1 tablet (5 mg total) by mouth at bedtime. 30 tablet 0   pantoprazole (PROTONIX) 40  MG tablet Take 40 mg by mouth 2 (two) times daily.     progesterone (PROMETRIUM) 200 MG capsule Take 200 mg by mouth at bedtime.     thyroid (ARMOUR) 90 MG tablet Take 90 mg by mouth daily.     FERREX 150 150 MG capsule Take 1 tablet by mouth daily. (Patient not taking: Reported on 09/04/2022)     pregabalin (LYRICA) 25 MG capsule Take 25 mg by mouth as needed. (Patient not taking: Reported on 05/03/2023)     ondansetron (ZOFRAN-ODT) 4 MG disintegrating tablet Take 4 mg by mouth 3 (three) times daily as needed for nausea or vomiting. (Patient not taking: Reported on 09/04/2022)     UBRELVY 100 MG TABS Take 1 tablet by mouth as needed.     apixaban (ELIQUIS) tablet 2.5 mg      No facility-administered medications prior to visit.   Final Medications at End of Visit    Current Meds  Medication Sig   ascorbic acid (VITAMIN  C) 500 MG/5ML syrup Take 500 mg by mouth daily.   carbamazepine (TEGRETOL) 200 MG tablet Take 1 tablet by mouth as needed.   estradiol (ESTRACE) 0.1 MG/GM vaginal cream Place 1 Applicatorful vaginally at bedtime.   oxybutynin (DITROPAN-XL) 5 MG 24 hr tablet Take 1 tablet (5 mg total) by mouth at bedtime.   pantoprazole (PROTONIX) 40 MG tablet Take 40 mg by mouth 2 (two) times daily.   progesterone (PROMETRIUM) 200 MG capsule Take 200 mg by mouth at bedtime.   thyroid (ARMOUR) 90 MG tablet Take 90 mg by mouth daily.   Radiology:   No results found.  Cardiac Studies:   Echocardiogram 02/02/2017: Left ventricle cavity is normal in size. Normal global wall motion. Normal diastolic filling pattern, normal LAP. Calculated EF 55%. Mild (Grade I) mitral regurgitation. Mild tricuspid regurgitation. No evidence of pulmonary hypertension. Trace pulmonic regurgitation.   Treadmill exercise stress test 01/29/2017: Indication: Palpitations, SoB Resting EKG demonstrates NSR. The patient exercised according to Bruce Protocol, Total time recorded 8:29 min achieving max heart rate of 160 which was 93% of THR for age and 10.16 METS of work. Stress terminated due to THR (>85% MPHR)/MPHR met. There was no ST-T changes of ischemia with exercise stress test. There were no significant arrhythmias. Hypertensive BP response, peak BP 240/72 mm Hg. Rec: No e/o ischemia by GXT. Exercise tolerence is normal. Hypertensive BP response, peak BP 240/72 mm Hg. No arrhythmias.Continue Preventive therapy.  EKG:   EKG 05/03/2023: Normal sinus rhythm at rate of 76 bpm, left atrial enlargement, poor R progression, probably normal variant.  No significant change from 09/04/2022.  Assessment     ICD-10-CM   1. Preoperative examination  Z01.818 EKG 12-Lead    2. Pure hypercholesterolemia  E78.00       Recommendations:   Brandy Dixon is a 57 y.o. 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  She is currently scheduled to have repeat lymphatic preserved liposuction by Dr. Hermelinda Medicus in [place], she presents to our office for preoperative risk stratification.    Patient is fairly active exercising regularly without issue from a cardiovascular standpoint.  She is without cardiac symptoms.  Patient has previous stress test and echocardiogram which were without abnormalities. She is overall low risk from a cardiovascular standpoint for upcoming surgical intervention given that she is active and asymptomatic, do not feel further cardiac work-up is indicated at this  time.  Patient's EKG is unchanged compared to previous and physical exam is stable.  Will not make changes to medications at this time.  See her PRN. I have forwarded the EKG which is essentially normal to her surgeon.   With regard to hypercholesterolemia, she prefers to loose weight and f/u with her PCP.    Yates Decamp, MD, [facility] 05/03/2023, 4:13 PM Office: [phone] Fax: [phone] Pager: [phone]      Kelton Pillar. Hermelinda Medicus, MD. Fax: [phone]

## 2023-05-10 ENCOUNTER — Telehealth: Payer: Self-pay | Admitting: Cardiology

## 2023-05-10 NOTE — Telephone Encounter (Signed)
Brandy Dixon is calling because she saw Dr. Jacinto Halim on 09/16 for clearance. Brandy Dixon stated the office never received the clearance. Brandy Dixon is requesting we fax the clearance to Dr. Henrene Pastor at [phone]. Please advise.

## 2023-05-11 NOTE — Telephone Encounter (Signed)
Faxed over Dr. Verl Dicker office note / surgical clearance to [phone] , as requested by pt.

## 2023-08-05 ENCOUNTER — Ambulatory Visit: Payer: 59 | Admitting: Cardiology

## 2023-08-05 ENCOUNTER — Ambulatory Visit: Payer: 59 | Admitting: Internal Medicine

## 2023-09-27 ENCOUNTER — Encounter: Payer: Self-pay | Admitting: Neurology

## 2023-10-14 ENCOUNTER — Encounter: Payer: Self-pay | Admitting: Neurology

## 2023-10-14 ENCOUNTER — Other Ambulatory Visit: Payer: 59

## 2023-10-14 ENCOUNTER — Ambulatory Visit: Payer: 59 | Admitting: Neurology

## 2023-10-14 VITALS — BP 170/109 | HR 88 | Ht 64.0 in | Wt 199.4 lb

## 2023-10-14 DIAGNOSIS — R519 Headache, unspecified: Secondary | ICD-10-CM

## 2023-10-14 DIAGNOSIS — R42 Dizziness and giddiness: Secondary | ICD-10-CM | POA: Diagnosis not present

## 2023-10-14 DIAGNOSIS — Z86718 Personal history of other venous thrombosis and embolism: Secondary | ICD-10-CM | POA: Diagnosis not present

## 2023-10-14 MED ORDER — CARBAMAZEPINE 200 MG PO TABS: ORAL_TABLET | ORAL | 6 refills | Status: DC

## 2023-10-14 NOTE — Progress Notes (Signed)
 NEUROLOGY CONSULTATION NOTE  Brandy Dixon MRN: [id number] DOB: 1966-03-25  Referring provider: Aquilla Hacker, PA-C Primary care provider: Theodora Blow, NP  Reason for consult:  dizziness  Thank you for your kind referral of Brandy Dixon for consultation of the above symptoms. Although her history is well known to you, please allow me to reiterate it for the purpose of our medical record. Records and images were personally reviewed where available.   HISTORY OF PRESENT ILLNESS: This is a pleasant 58 year old right-handed woman with a history of hypothyrodism, DVT, trigeminal neuralgia, presenting for evaluation of dizziness. She recalls an episode 20 years ago where she had a little dizziness for several days, she saw an allergy specialist who started Allegra with improvement in symptoms. She had a different type of event on 08/07/23, when she opened her eyes from sleep at 5am, everything in the room started spinning. She had nausea and vomiting. Symptoms worse with any type of head movement. This lasted on and off for 3 days where she had a hard time walking to the bathroom, feeling like she would fall over to her right side. The spinning sensation resolved, however for another 2 months, she continued to feel dizziness like a "shaking in my brain/skull." She had 2 more episodes of spinning but they did not last long, last episode of vertigo was 09/18/23. In the past couple of weeks, the dizziness is a lot better but still there, turning her head worsens symptoms. She feels like she is a little off balance. In the past month, she started having almost daily bad headaches. She has a history of migraines and states they were not as bad as those. She took [place] which did not help as much as Excedrin migraine which masks the pain but she still feels it. When the shaking in her brain/skull is bad, her daughter have noticed her eyes would squint up. She feels her vision is okay, but when the pain is  bad, it feels like there is a film over both eyes. Both eyes stay red a lot so she uses eye drops. No diplopia. She had normal vestibular testing.  She reports that 2-3 years ago, she had an episode of sudden freezing on the left side of her face with stabbing pain lasting 20 minutes. She was diagnosed with trigeminal neuralgia and started on Tegretol 200mg  TID and Pregabalin 25mg  at bedtime. Symptoms have significantly improved since then, when she feels something, she takes Tegretol and pain goes away.  She takes the Tegretol a couple times a day. With the new onset headaches, pain is in the back of her head radiating to the right side of her face and behind her eye last week, with pressure and some throbbing. She took Tegretol which did help. She had stopped the Pregablin but took it the other night for the first time in a couple of years.Pain can last all day on a bad day, on a good day it still lasts 3/4 of the day. Today is a good day but if she presses on her head, she gets a little dizzy. She has a hard time when lying on a pillow on the right side of her face. She has occasional neck pain. She has occasional tingling in both legs, arms are unaffected. No bowel/bladder dysfunction. When she talks for a long time, she feels overstimulated like her eyes will roll back or become crossed. She had a hip replacement in 07/2023 and had lymphedema. No falls. She  denies any prior head injuries or infections. She notes the Tegretol also helps with sleep, she gets 6-8 hours of sleep. She has had sleep issues for 10 years. She had blood clots from OCP use at age 28, then a DVT in her leg in 2017 treated with Eliquis.     PAST MEDICAL HISTORY: Past Medical History:  Diagnosis Date   DVT of leg (deep venous thrombosis) (HCC)    Dyspnea    Hypothyroidism    Lipoedema    Palpitations     PAST SURGICAL HISTORY: Past Surgical History:  Procedure Laterality Date   LIPOSUCTION EXTREMITIES  02/2017   3 total     MEDICATIONS: Current Outpatient Medications on File Prior to Visit  Medication Sig Dispense Refill   ascorbic acid (VITAMIN C) 500 MG/5ML syrup Take 500 mg by mouth daily.     carbamazepine (TEGRETOL) 200 MG tablet Take 1 tablet by mouth as needed.     cholecalciferol (VITAMIN D3) 25 MCG (1000 UNIT) tablet Take 1,000 Units by mouth daily.     cyanocobalamin (VITAMIN B12) 1000 MCG tablet Take 1,000 mcg by mouth daily.     pantoprazole (PROTONIX) 40 MG tablet Take 40 mg by mouth daily.     pregabalin (LYRICA) 25 MG capsule Take 25 mg by mouth as needed.     progesterone (PROMETRIUM) 200 MG capsule Take 200 mg by mouth at bedtime.     thyroid (ARMOUR) 90 MG tablet Take 90 mg by mouth daily.     No current facility-administered medications on file prior to visit.    ALLERGIES: No Known Allergies  FAMILY HISTORY: Family History  Problem Relation Age of Onset   Thyroid disease Mother    Breast cancer Maternal Grandmother     SOCIAL HISTORY: Social History   Socioeconomic History   Marital status: Married    Spouse name: Not on file   Number of children: 2   Years of education: Not on file   Highest education level: Not on file  Occupational History   Not on file  Tobacco Use   Smoking status: Never   Smokeless tobacco: Never  Vaping Use   Vaping status: Never Used  Substance and Sexual Activity   Alcohol use: Yes    Comment: socially   Drug use: No   Sexual activity: Yes    Birth control/protection: Condom  Other Topics Concern   Not on file  Social History Narrative   Are you right handed or left handed? Right    Are you currently employed ?  no   What is your current occupation? NA   Do you live at home alone? No    Who lives with you? Family    What type of home do you live in: 1 story or 2 story?        Social Drivers of Corporate investment banker Strain: Low Risk  (10/26/2021)   Received from [facility], [facility]   Overall Financial Resource  Strain (CARDIA)    Difficulty of Paying Living Expenses: Not hard at all  Food Insecurity: Low Risk  (09/13/2023)   Received from [facility]   Hunger Vital Sign    Worried About Running Out of Food in the Last Year: Never true    Ran Out of Food in the Last Year: Never true  Transportation Needs: No Transportation Needs (09/13/2023)   Received from [facility]    In the past 12 months,  has lack of reliable transportation kept you from medical appointments, meetings, work or from getting things needed for daily living? : No  Physical Activity: Sufficiently Active (10/26/2021)   Received from [facility], [facility]   Exercise Vital Sign    Days of Exercise per Week: 5 days    Minutes of Exercise per Session: 30 min  Stress: Stress Concern Present (10/26/2021)   Received from [facility], [facility] - Occupational Stress Questionnaire    Feeling of Stress : To some extent  Social Connections: Unknown (12/26/2021)   Received from [facility], [facility]   Social Network    Social Network: Not on file  Intimate Partner Violence: Unknown (11/17/2021)   Received from [facility], [facility]   HITS    Physically Hurt: Not on file    Insult or Talk Down To: Not on file    Threaten Physical Harm: Not on file    Scream or Curse: Not on file     PHYSICAL EXAM: Vitals:   10/14/23 1026  BP: (!) 170/109  Pulse: 88  SpO2: 99%   General: No acute distress Head:  Normocephalic/atraumatic Skin/Extremities: No rash, no edema Neurological Exam: Mental status: alert and awake, no dysarthria or aphasia, Fund of knowledge is appropriate.  Attention and concentration are normal.     Cranial nerves: CN I: not tested CN II: pupils equal, round, visual fields intact CN III, IV, VI:  full range of motion, no nystagmus, no ptosis CN V: facial sensation intact CN VII: upper and lower face symmetric CN VIII: hearing  intact to conversation CN XI: sternocleidomastoid and trapezius muscles intact CN XII: tongue midline Bulk & Tone: normal, no fasciculations. Motor: 5/5 throughout with no pronator drift. Sensation: intact to light touch, cold, pin, vibration sense.  No extinction to double simultaneous stimulation.  Romberg test negative Deep Tendon Reflexes: +2 throughout Cerebellar: no incoordination on finger to nose testing, difficulty with right heel to shin due to right hip issues Gait: narrow-based and steady, able to tandem walk adequately. Tremor: none   IMPRESSION: This is a pleasant 58 year old right-handed woman with a history of hypothyrodism, DVT, trigeminal neuralgia, presenting for evaluation of dizziness. She had a bout of vertigo in 07/2023, the spinning sensation has resolved however she continues to have a different type of dizziness with "shaking in my brain/skull," balance difficulties, as well as new onset headaches. Etiology of symptoms unclear, MRI brain with and without contrast and MRV without contrast will be ordered to assess for underlying structural abnormality. Check ESR, CRP, ANA, SS-A, SS-B. She was advised to restart the Tegretol 200mg : 1 tablet every night for 1 week, then increase to 1 tablet BID. Follow-up after MRI, call for any changes.    Thank you for allowing me to participate in the care of this patient. Please do not hesitate to call for any questions or concerns.   Patrcia Dolly, M.D.  CC: Aquilla Hacker, PA-C, Theodora Blow, NP

## 2023-10-14 NOTE — Patient Instructions (Addendum)
 Good to meet you.  Have bloodwork done for ESR, CRP, ANA, SS-A, SS-B  2. Schedule MRI brain with and without contrast, MRV without contrast  3. Start taking the Tegretol 200mg : take 1 tablet every night for 1 week, then increase to 1 tablet twice a day  4. Please let me know when the MRI is scheduled and we will plan for a follow-up after MRI, call for any changes

## 2023-10-16 LAB — SEDIMENTATION RATE: Sed Rate: 6 mm/h (ref 0–30)

## 2023-10-16 LAB — ANA: Anti Nuclear Antibody (ANA): NEGATIVE

## 2023-10-16 LAB — C-REACTIVE PROTEIN: CRP: 3.9 mg/L (ref <8.0)

## 2023-10-16 LAB — SJOGRENS SYNDROME-B EXTRACTABLE NUCLEAR ANTIBODY: SSB (La) (ENA) Antibody, IgG: <1 AI

## 2023-10-16 LAB — SJOGRENS SYNDROME-A EXTRACTABLE NUCLEAR ANTIBODY: SSA (Ro) (ENA) Antibody, IgG: <1 AI

## 2023-11-02 ENCOUNTER — Ambulatory Visit
Admission: RE | Admit: 2023-11-02 | Discharge: 2023-11-02 | Disposition: A | Discharge status: Home / Self Care | Payer: 59 | Source: Ambulatory Visit | Attending: Neurology

## 2023-11-02 ENCOUNTER — Ambulatory Visit
Admission: RE | Admit: 2023-11-02 | Discharge: 2023-11-02 | Disposition: A | Discharge status: Home / Self Care | Payer: 59 | Source: Ambulatory Visit | Attending: Neurology | Admitting: Neurology

## 2023-11-02 DIAGNOSIS — R519 Headache, unspecified: Secondary | ICD-10-CM

## 2023-11-02 DIAGNOSIS — Z86718 Personal history of other venous thrombosis and embolism: Secondary | ICD-10-CM

## 2023-11-02 DIAGNOSIS — R42 Dizziness and giddiness: Secondary | ICD-10-CM

## 2023-11-02 MED ORDER — GADOPICLENOL 0.5 MMOL/ML IV SOLN
9.0000 mL | Freq: Once | INTRAVENOUS | Status: AC | PRN
  Administered 2023-11-02: 9 mL via INTRAVENOUS

## 2023-11-04 ENCOUNTER — Encounter: Payer: Self-pay | Admitting: Neurology

## 2023-11-08 ENCOUNTER — Ambulatory Visit: Payer: 59 | Admitting: Neurology

## 2023-11-17 ENCOUNTER — Encounter: Payer: Self-pay | Admitting: Neurology

## 2023-11-25 MED ORDER — PREGABALIN 25 MG PO CAPS: ORAL_CAPSULE | ORAL | 5 refills | Status: AC

## 2023-11-30 ENCOUNTER — Encounter: Payer: Self-pay | Admitting: Neurology

## 2023-11-30 ENCOUNTER — Ambulatory Visit (INDEPENDENT_AMBULATORY_CARE_PROVIDER_SITE_OTHER): Admitting: Neurology

## 2023-11-30 VITALS — BP 162/97 | HR 77 | Ht 64.0 in | Wt 194.8 lb

## 2023-11-30 DIAGNOSIS — G43809 Other migraine, not intractable, without status migrainosus: Secondary | ICD-10-CM | POA: Diagnosis not present

## 2023-11-30 DIAGNOSIS — G500 Trigeminal neuralgia: Secondary | ICD-10-CM | POA: Diagnosis not present

## 2023-11-30 DIAGNOSIS — R42 Dizziness and giddiness: Secondary | ICD-10-CM

## 2023-11-30 MED ORDER — CARBAMAZEPINE 200 MG PO TABS: ORAL_TABLET | ORAL | 3 refills | Status: AC

## 2023-11-30 NOTE — Patient Instructions (Addendum)
 Good to see you.  Increase Tegretol 200mg : take 1 tablet in AM, 1 and 1/2 tablets at night and see how you do. Depending on how you feel, we can increase further to 1 tablet in AM, 2 tablets in PM  2. Referral will be sent to [facility] for vestibular therapy and gaze stabilization exercises  3. Start monitoring BP daily for a few weeks, and check BP also when symptomatic  4. Follow-up in 3 months,call for any changes

## 2023-11-30 NOTE — Progress Notes (Unsigned)
 NEUROLOGY FOLLOW UP OFFICE NOTE  Brandy Dixon [id number] Oct 05, 1965  HISTORY OF PRESENT ILLNESS: I had the pleasure of seeing Brandy Dixon in follow-up in the neurology clinic on 11/30/2023.  The patient was last seen 2 months ago for dizziness. She is again accompanied by her husband who helps supplement the history today.  Records and images were personally reviewed where available. She had a significant episode of vertigo in December 2024 that lasted for several days, the vertigo has not recurred since February, however she continues to have dizziness. It is a lot better some days more than other, but she has to be careful when laying down in bed or turning her head too fast, she would get a wave of dizziness like lightheadedness. Dizziness is very positional. She was having daily headaches, we discussed restarting Tegretol 200mg  BID that she previously took for Trigeminal Neuralgia. The headaches are not daily anymore, occurring a couple of times a week with good response to Excedrin migraine.   I personally reviewed MRI brain with and without contrast and MRV brain which were normal. Bloodwork normal for ESR, CRP, ANA, SS-A, SS-B. She has noticed cold sensitivity in the upper right and lower left molar, she uses warm water now. She had a little nerve pain inside her mouth on the left, it was fast and quick. She denies any tinnitus, no focal numbness/tingling/weakness. She notes sometimes she would list to the left side even if she is not dizzy, no falls. She feels tired with the daytime dose of Tegretol.    History on Initial Assessment 10/14/2023: This is a pleasant 58 year old right-handed woman with a history of hypothyrodism, DVT, trigeminal neuralgia, presenting for evaluation of dizziness. She recalls an episode 20 years ago where she had a little dizziness for several days, she saw an allergy specialist who started Allegra with improvement in symptoms. She had a different type of event on  08/07/23, when she opened her eyes from sleep at 5am, everything in the room started spinning. She had nausea and vomiting. Symptoms worse with any type of head movement. This lasted on and off for 3 days where she had a hard time walking to the bathroom, feeling like she would fall over to her right side. The spinning sensation resolved, however for another 2 months, she continued to feel dizziness like a "shaking in my brain/skull." She had 2 more episodes of spinning but they did not last long, last episode of vertigo was 09/18/23. In the past couple of weeks, the dizziness is a lot better but still there, turning her head worsens symptoms. She feels like she is a little off balance. In the past month, she started having almost daily bad headaches. She has a history of migraines and states they were not as bad as those. She took [place] which did not help as much as Excedrin migraine which masks the pain but she still feels it. When the shaking in her brain/skull is bad, her daughter have noticed her eyes would squint up. She feels her vision is okay, but when the pain is bad, it feels like there is a film over both eyes. Both eyes stay red a lot so she uses eye drops. No diplopia. She had normal vestibular testing.  She reports that 2-3 years ago, she had an episode of sudden freezing on the left side of her face with stabbing pain lasting 20 minutes. She was diagnosed with trigeminal neuralgia and started on Tegretol 200mg  TID and  Pregabalin 25mg  at bedtime. Symptoms have significantly improved since then, when she feels something, she takes Tegretol and pain goes away.  She takes the Tegretol a couple times a day. With the new onset headaches, pain is in the back of her head radiating to the right side of her face and behind her eye last week, with pressure and some throbbing. She took Tegretol which did help. She had stopped the Pregablin but took it the other night for the first time in a couple of  years.Pain can last all day on a bad day, on a good day it still lasts 3/4 of the day. Today is a good day but if she presses on her head, she gets a little dizzy. She has a hard time when lying on a pillow on the right side of her face. She has occasional neck pain. She has occasional tingling in both legs, arms are unaffected. No bowel/bladder dysfunction. When she talks for a long time, she feels overstimulated like her eyes will roll back or become crossed. She had a hip replacement in 07/2023 and had lymphedema. No falls. She denies any prior head injuries or infections. She notes the Tegretol also helps with sleep, she gets 6-8 hours of sleep. She has had sleep issues for 10 years. She had blood clots from OCP use at age 60, then a DVT in her leg in 2017 treated with Eliquis.   PAST MEDICAL HISTORY: Past Medical History:  Diagnosis Date   DVT of leg (deep venous thrombosis) (HCC)    Dyspnea    Hypothyroidism    Lipoedema    Palpitations     MEDICATIONS: Current Outpatient Medications on File Prior to Visit  Medication Sig Dispense Refill   carbamazepine (TEGRETOL) 200 MG tablet Take 1 tablet twice a day 60 tablet 6   cholecalciferol (VITAMIN D3) 25 MCG (1000 UNIT) tablet Take 1,000 Units by mouth daily.     cyanocobalamin (VITAMIN B12) 1000 MCG tablet Take 1,000 mcg by mouth daily.     pantoprazole (PROTONIX) 40 MG tablet Take 40 mg by mouth daily.     pregabalin (LYRICA) 25 MG capsule Take 1 tablet as needed daily for facial pain 30 capsule 5   thyroid (ARMOUR) 90 MG tablet Take 90 mg by mouth daily.     progesterone (PROMETRIUM) 200 MG capsule Take 200 mg by mouth at bedtime. (Patient not taking: Reported on 11/30/2023)     No current facility-administered medications on file prior to visit.    ALLERGIES: No Known Allergies  FAMILY HISTORY: Family History  Problem Relation Age of Onset   Thyroid disease Mother    Breast cancer Maternal Grandmother     SOCIAL  HISTORY: Social History   Socioeconomic History   Marital status: Married    Spouse name: Not on file   Number of children: 2   Years of education: Not on file   Highest education level: Not on file  Occupational History   Not on file  Tobacco Use   Smoking status: Never   Smokeless tobacco: Never  Vaping Use   Vaping status: Never Used  Substance and Sexual Activity   Alcohol use: Yes    Comment: socially   Drug use: No   Sexual activity: Yes    Birth control/protection: Condom  Other Topics Concern   Not on file  Social History Narrative   Are you right handed or left handed? Right    Are you currently employed ?  no   What is your current occupation? NA   Do you live at home alone? No    Who lives with you? Family    What type of home do you live in: 1 story or 2 story?        Social Drivers of Corporate investment banker Strain: Low Risk  (10/26/2021)   Received from [facility]   Overall Financial Resource Strain (CARDIA)    Difficulty of Paying Living Expenses: Not hard at all  Food Insecurity: Low Risk  (09/13/2023)   Received from [facility]   Hunger Vital Sign    Worried About Running Out of Food in the Last Year: Never true    Ran Out of Food in the Last Year: Never true  Transportation Needs: No Transportation Needs (09/13/2023)   Received from [facility]    In the past 12 months, has lack of reliable transportation kept you from medical appointments, meetings, work or from getting things needed for daily living? : No  Physical Activity: Sufficiently Active (10/26/2021)   Received from [facility], [facility]   Exercise Vital Sign    Days of Exercise per Week: 5 days    Minutes of Exercise per Session: 30 min  Stress: Stress Concern Present (10/26/2021)   Received from [facility] - Occupational Stress Questionnaire    Feeling of Stress : To some  extent  Social Connections: Unknown (12/26/2021)   Received from [facility], [facility]   Social Network    Social Network: Not on file  Intimate Partner Violence: Unknown (11/17/2021)   Received from [facility]   HITS    Physically Hurt: Not on file    Insult or Talk Down To: Not on file    Threaten Physical Harm: Not on file    Scream or Curse: Not on file     PHYSICAL EXAM: Vitals:   11/30/23 1321  BP: (!) 162/97  Pulse: 77  SpO2: 99%   General: No acute distress Head:  Normocephalic/atraumatic Skin/Extremities: No rash, no edema Neurological Exam: alert and awake. No aphasia or dysarthria. Fund of knowledge is appropriate. Attention and concentration are normal.   Cranial nerves: Pupils equal, round. Extraocular movements intact with no nystagmus. Visual fields full.  No facial asymmetry.  Motor: Bulk and tone normal, muscle strength 5/5 throughout with no pronator drift.   Finger to nose testing intact.  Gait narrow-based and steady, unable to tandem walk today.  Romberg negative.   IMPRESSION: This is a pleasant 58 yo RH woman with a history of hypothyrodism, DVT, trigeminal neuralgia, who presented with dizziness. She had a bout of vertigo in 07/2023, the spinning sensation has resolved however she continues to have a different type of dizziness that is very positional, as well as balance difficulties. MRI brain normal. We discussed Vestibular therapy for gaze stabilization exercises and balance therapy. The Tegretol has helped with her headaches, we discussed the possibility of vestibular migraines, increase Tegretol to 200mg  in AM, 400mg  in PM. Monitor side effects. She has prn Lyrica 25mg  for severe trigeminal neuralgia pain. BP today elevated, this may cause dizziness as well. She was advised to start monitoring BP at home and when symptomatic. Follow-up in 3 months, call for any changes.    Thank you for allowing me to participate in her care.  Please  do not hesitate to call for any  questions or concerns.    Rayfield Cairo, M.D.   CC: Otto Bloodgood, NP

## 2023-12-02 ENCOUNTER — Ambulatory Visit: Admitting: Physical Therapy

## 2024-02-29 ENCOUNTER — Ambulatory Visit: Admitting: Neurology

## 2024-09-03 NOTE — Progress Notes (Signed)
 "  Virtual Visit via Video Note  This visit type was conducted with patient consent. This format is felt to be most appropriate for this patient at this time. Physical exam was limited by quality of the video and audio technology used for the visit.    Consent was obtained for video visit:  Yes.   Answered questions that patient had about telehealth interaction:  Yes.   Patient is aware of the limitations, risks, security and privacy concerns of performing an evaluation and management service by telemedicine. The patient expressed understanding and agreed to proceed.  Pt location: Home Physician Location: office Name of referring provider:  Charley Skippy HERO, NP I connected with Brandy Dixon at patients initiation/request on 09/04/2024 at  3:40 PM EST by video enabled telemedicine application and verified that I am speaking with the correct person using two identifiers. Pt MRN:  [id number] Pt DOB:  July 25, 1966 Video Participants:  Brandy Dixon  Discussed the use of AI scribe software for clinical note transcription with the patient, who gave verbal consent to proceed.  History of Present Illness The patient had a virtual video visit on 09/04/2024. She was last seen in the neurology clinic 9 months ago for dizziness and headaches. On her last visit, she was referred for Vestibular therapy but was unable to schedule as she was getting much better. Unfortunately, vertigo recurred 2 weeks ago. She was babysitting her granddaughter and lay down, rubbed her eyelid then suddenly had the spinning sensation. It was brief, however she continues to feel that if she looks to either side or up/down, there is a 1-second version of it that does not progress. Since the initial event in 07/2023, she has not stopped having some form of dizziness, being careful when lying down or standing.   On her last visit, she reported Tegretol  helped with the headaches, dose increased to 200mg  in AM, 400mg  in PM. She reports  today that she only takes it as needed for trigeminal neuralgia. She has Lyrica  but has not taken it recently. She experiences headaches that have become more frequent and severe, occurring almost daily. She takes four Advil  to alleviate the pain. The headaches began two days before the recent vertigo episode. She has migraines 1-2 times a month lasting 1-2 days. She had a horrible headache for 2 days then had the vertigo 2 weeks ago. She notes her eyes are red a lot, she needs to use eye drops frequently. She asks about anxiety, she feels she is OCD about everything, that everything has to be perfect all the time. Sleep is disrupted, she gets 6-7 hours but wakes up after 3 hours.     History on Initial Assessment 10/14/2023: This is a pleasant 59 year old right-handed woman with a history of hypothyrodism, DVT, trigeminal neuralgia, presenting for evaluation of dizziness. She recalls an episode 20 years ago where she had a little dizziness for several days, she saw an allergy specialist who started Allegra with improvement in symptoms. She had a different type of event on 08/07/23, when she opened her eyes from sleep at 5am, everything in the room started spinning. She had nausea and vomiting. Symptoms worse with any type of head movement. This lasted on and off for 3 days where she had a hard time walking to the bathroom, feeling like she would fall over to her right side. The spinning sensation resolved, however for another 2 months, she continued to feel dizziness like a shaking in my brain/skull. She had  2 more episodes of spinning but they did not last long, last episode of vertigo was 09/18/23. In the past couple of weeks, the dizziness is a lot better but still there, turning her head worsens symptoms. She feels like she is a little off balance. In the past month, she started having almost daily bad headaches. She has a history of migraines and states they were not as bad as those. She took Ubrelvy  which did not help as much as Excedrin migraine which masks the pain but she still feels it. When the shaking in her brain/skull is bad, her daughter have noticed her eyes would squint up. She feels her vision is okay, but when the pain is bad, it feels like there is a film over both eyes. Both eyes stay red a lot so she uses eye drops. No diplopia. She had normal vestibular testing.  She reports that 2-3 years ago, she had an episode of sudden freezing on the left side of her face with stabbing pain lasting 20 minutes. She was diagnosed with trigeminal neuralgia and started on Tegretol  200mg  TID and Pregabalin  25mg  at bedtime. Symptoms have significantly improved since then, when she feels something, she takes Tegretol  and pain goes away.  She takes the Tegretol  a couple times a day. With the new onset headaches, pain is in the back of her head radiating to the right side of her face and behind her eye last week, with pressure and some throbbing. She took Tegretol  which did help. She had stopped the Pregablin but took it the other night for the first time in a couple of years.Pain can last all day on a bad day, on a good day it still lasts 3/4 of the day. Today is a good day but if she presses on her head, she gets a little dizzy. She has a hard time when lying on a pillow on the right side of her face. She has occasional neck pain. She has occasional tingling in both legs, arms are unaffected. No bowel/bladder dysfunction. When she talks for a long time, she feels overstimulated like her eyes will roll back or become crossed. She had a hip replacement in 07/2023 and had lymphedema. No falls. She denies any prior head injuries or infections. She notes the Tegretol  also helps with sleep, she gets 6-8 hours of sleep. She has had sleep issues for 10 years. She had blood clots from OCP use at age 54, then a DVT in her leg in 2017 treated with Eliquis .   Diagnostic Data: MRI brain with and without contrast and MRV  brain 11/2023 were normal.  Bloodwork normal for ESR, CRP, ANA, SS-A, SS-B.    Medications Ordered Prior to Encounter[1]   Observations/Objective:   GEN:  The patient appears stated age and is in NAD. Neurological examination: Patient is awake, alert. Intact fluency and comprehension.Cranial nerves: Extraocular movements intact with no nystagmus. No facial asymmetry. Motor: moves all extremities symmetrically, at least anti-gravity x 4.    Assessment and Plan:   This is a pleasant 59 yo RH woman with a history of hypothyrodism, DVT, trigeminal neuralgia, who presented with dizziness. She had a bout of vertigo in 07/2023 then had continued positional dizziness and balance difficulties after. Symptoms got better but she had another episode of vertigo 2 weeks ago. She is also reporting frequent and severe headaches. 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 She is agreeable to starting Duloxetine  30mg  at  bedtime, side effects discussed. We may increase to 60mg  if needed. She would also benefit from Vestibular rehab, referral sent. Follow-up in 3 months, call for any changes.    Follow Up Instructions:   -I discussed the assessment and treatment plan with the patient. The patient was provided an opportunity to ask questions and all were answered. The patient agreed with the plan and demonstrated an understanding of the instructions.   The patient was advised to call back or seek an in-person evaluation if the symptoms worsen or if the condition fails to improve as anticipated.     Brandy CHRISTELLA Shivers, MD     [1]  Current Outpatient Medications on File Prior to Visit  Medication Sig Dispense Refill   carbamazepine  (TEGRETOL ) 200 MG tablet Take 1 tablet in AM, 2 tablets in PM 270 tablet 3   cholecalciferol (VITAMIN D3) 25 MCG (1000 UNIT) tablet Take 1,000 Units by mouth daily.     cyanocobalamin (VITAMIN B12) 1000 MCG tablet Take 1,000 mcg by mouth  daily.     pantoprazole (PROTONIX) 40 MG tablet Take 40 mg by mouth daily.     pregabalin  (LYRICA ) 25 MG capsule Take 1 tablet as needed daily for facial pain 30 capsule 5   progesterone (PROMETRIUM) 200 MG capsule Take 200 mg by mouth at bedtime. (Patient not taking: Reported on 11/30/2023)     thyroid (ARMOUR) 90 MG tablet Take 90 mg by mouth daily.     No current facility-administered medications on file prior to visit.   "

## 2024-09-04 ENCOUNTER — Encounter: Payer: Self-pay | Admitting: Neurology

## 2024-09-04 ENCOUNTER — Telehealth: Payer: Self-pay | Admitting: Neurology

## 2024-09-04 VITALS — Ht 64.0 in | Wt 190.0 lb

## 2024-09-04 DIAGNOSIS — R42 Dizziness and giddiness: Secondary | ICD-10-CM | POA: Diagnosis not present

## 2024-09-04 DIAGNOSIS — G43809 Other migraine, not intractable, without status migrainosus: Secondary | ICD-10-CM | POA: Diagnosis not present

## 2024-09-04 MED ORDER — DULOXETINE HCL 30 MG PO CPEP: ORAL_CAPSULE | ORAL | 5 refills | Status: AC

## 2024-09-04 NOTE — Telephone Encounter (Signed)
 Pt called to go over chart

## 2024-09-04 NOTE — Patient Instructions (Addendum)
 Good to see you! I hope you feel better soon.  Start Cymbalta  (Duloxetine ) 30mg : take 1 capsule every night  2. Referral will be sent to [facility] for Vestibular therapy  3. Follow-up in 3 months, call for any changes

## 2024-09-04 NOTE — Telephone Encounter (Signed)
 Pt is returning call to go over what is needed for her virtual appointment  today. Thanks

## 2024-09-12 ENCOUNTER — Other Ambulatory Visit: Payer: Self-pay

## 2024-09-12 DIAGNOSIS — R42 Dizziness and giddiness: Secondary | ICD-10-CM

## 2024-09-12 DIAGNOSIS — G43809 Other migraine, not intractable, without status migrainosus: Secondary | ICD-10-CM

## 2024-09-15 ENCOUNTER — Ambulatory Visit: Attending: Neurology | Admitting: Physical Therapy

## 2024-09-15 ENCOUNTER — Encounter: Payer: Self-pay | Admitting: Physical Therapy

## 2024-09-15 VITALS — BP 158/88 | HR 81

## 2024-09-15 DIAGNOSIS — G43809 Other migraine, not intractable, without status migrainosus: Secondary | ICD-10-CM | POA: Insufficient documentation

## 2024-09-15 DIAGNOSIS — R42 Dizziness and giddiness: Secondary | ICD-10-CM | POA: Insufficient documentation

## 2024-09-15 DIAGNOSIS — H8111 Benign paroxysmal vertigo, right ear: Secondary | ICD-10-CM | POA: Insufficient documentation

## 2024-09-19 ENCOUNTER — Encounter: Payer: Self-pay | Admitting: Physical Therapy

## 2024-09-19 ENCOUNTER — Ambulatory Visit: Admitting: Physical Therapy

## 2024-09-19 VITALS — BP 166/83 | HR 83

## 2024-09-19 DIAGNOSIS — H8111 Benign paroxysmal vertigo, right ear: Secondary | ICD-10-CM

## 2024-09-19 DIAGNOSIS — R42 Dizziness and giddiness: Secondary | ICD-10-CM

## 2024-09-22 ENCOUNTER — Encounter: Payer: Self-pay | Admitting: Physical Therapy

## 2024-09-22 ENCOUNTER — Ambulatory Visit: Admitting: Physical Therapy

## 2024-09-22 VITALS — BP 152/76 | HR 76

## 2024-09-22 DIAGNOSIS — R42 Dizziness and giddiness: Secondary | ICD-10-CM

## 2024-09-22 DIAGNOSIS — H8111 Benign paroxysmal vertigo, right ear: Secondary | ICD-10-CM

## 2024-09-22 NOTE — Therapy (Incomplete)
 " OUTPATIENT PHYSICAL THERAPY VESTIBULAR TREATMENT     Patient Name: Brandy Dixon MRN: [id number] DOB:1966/04/15, 59 y.o., female Today's Date: 09/22/2024  END OF SESSION:  PT End of Session - 09/22/24 1145     Visit Number 3    Number of Visits 5    Date for Recertification  10/15/24    Authorization Type [employer]    PT Start Time 1145    PT Stop Time 1210   full time not used due to BPPV tx   PT Time Calculation (min) 25 min    Activity Tolerance Patient tolerated treatment well    Behavior During Therapy [facility] for tasks assessed/performed          Past Medical History:  Diagnosis Date   DVT of leg (deep venous thrombosis) (HCC)    Dyspnea    Hypothyroidism    Lipoedema    Palpitations    Past Surgical History:  Procedure Laterality Date   LIPOSUCTION EXTREMITIES  02/2017   3 total   Patient Active Problem List   Diagnosis Date Noted   Degeneration of lumbar intervertebral disc 09/17/2020   Preoperative cardiovascular examination 09/29/2018   History of DVT (deep vein thrombosis) 09/29/2018   Thyroid disease    Lipoedema     PCP: Charley Skippy HERO, NP  REFERRING PROVIDER: Georjean Darice HERO, MD  REFERRING DIAG: R42 (ICD-10-CM) - Dizziness G43.809 (ICD-10-CM) - Vestibular migraine  THERAPY DIAG:  BPPV (benign paroxysmal positional vertigo), right  Dizziness and giddiness  ONSET DATE: 09/12/2024  Rationale for Evaluation and Treatment: Rehabilitation  SUBJECTIVE:   SUBJECTIVE STATEMENT: Can tell that things are getting better, but notices that if she lays farther back on her bed. When she looks straight ahead, will feel a very brief swoosh. Did not feel good after last session, went home and slept for 2 hours. Notes she did get relief gradually afterwards.   Pt accompanied by: self  PERTINENT HISTORY: PMH: vestibular migraines, hx of DVT, hypothyroidism, lipoedema , trigeminal neuralgia   Per Dr. Georjean on 09/04/24: Unfortunately, vertigo  recurred 2 weeks ago. She was babysitting her granddaughter and lay down, rubbed her eyelid then suddenly had the spinning sensation. It was brief, however she continues to feel that if she looks to either side or up/down, there is a 1-second version of it that does not progress. Since the initial event in 07/2023, she has not stopped having some form of dizziness, being careful when lying down or standing   PAIN:  Are you having pain? No  Vitals:   09/22/24 1150  BP: (!) 152/76  Pulse: 76      PRECAUTIONS: None  FALLS: Has patient fallen in last 6 months? No  PLOF: Independent Not driving, but when having a bad episode wasn't able to do a lot of things   PATIENT GOALS: Wants to not be dizzy anymore   OBJECTIVE:  Note: Objective measures were completed at Evaluation unless otherwise noted.  DIAGNOSTIC FINDINGS: MRI brain 11/17/23: IMPRESSION: 1. Normal MRI of the brain performed with contrast. 2. Normal MRV of the head performed without contrast.    COGNITION: Overall cognitive status: Within functional limits for tasks assessed    POSTURE:  No Significant postural limitations  Cervical ROM:   WFL   GAIT: Gait pattern: WFL Distance walked: Clinic distances  Assistive device utilized: None Level of assistance: Complete Independence   VESTIBULAR ASSESSMENT:  GENERAL OBSERVATION: Ambulates in independently   SYMPTOM BEHAVIOR:  Subjective history: See  above  Non-Vestibular symptoms: neck pain, migraine symptoms, and feels like her skull is crushing her head   Type of dizziness: Spinning/Vertigo and World moves  Frequency: Was bad dizzy for 2 weeks after initial episode in January, doing better now but is more careful   Duration: not long   Aggravating factors: Induced by position change: lying supine and supine to sit and Induced by motion: looking up at the ceiling, bending down to the ground, and turning head quickly, has to sit her bed up, can not lay flat  and is paranoid about it   Relieving factors: slow movements  Progression of symptoms: better  OCULOMOTOR EXAM:  Ocular Alignment: normal  Ocular ROM: No Limitations  Spontaneous Nystagmus: absent  Gaze-Induced Nystagmus: absent  Smooth Pursuits: intact  Saccades: intact  VESTIBULAR - OCULAR REFLEX:   Slow VOR: Normal  VOR Cancellation: Normal   POSITIONAL TESTING: Right Dix-Hallpike: upbeating, right nystagmus and rotary nystagmus, wasn't able to lay pt back all the way with pt demonstrating brisk rotary, upbeating nystagmus and pt needing to immediately sit back up  When going into DixHallpike position for the Epley, pt having R upbeating rotary nystagmus that was more mild and lasting approx 10 seconds                                                                                                                             TREATMENT DATE: 09/22/24  Therapeutic Activity: Vitals:   09/22/24 1150  BP: (!) 152/76  Pulse: 76    POSITIONAL TESTING: Right Dix-Hallpike: upbeating, right nystagmus and rotary nystagmus, lasting approx 5-6 seconds, very mild   Canalith Repositioning: Epley Right: Number of Reps: 3, Response to Treatment: symptoms improved, and Comment: pt reporting dizziness when sitting up from last position that was very brief. After 3rd rep, pt did not report any symptoms with coming upright   After maneuvers, pt reporting feeling incr pressure in her head. Symptoms did subside and pt able to ambulate out of session independently with no issues.    Reviewed education regarding etiology of BPPV, treatment with Epley, reoccurrence and causes and can be associated with vestibular migraines.    PATIENT EDUCATION: Education details: See above Person educated: Patient Education method: Explanation, Demonstration, and Verbal cues Education comprehension: verbalized understanding and returned demonstration  HOME EXERCISE PROGRAM: Will provide at future session if  appropriate   GOALS: Goals reviewed with patient? Yes  SHORT TERM GOALS: ALL STGS = LTGS  LONG TERM GOALS: Target date: 10/13/2024  Pt will be negative for R posterior canalithiasis in order to demo decr dizziness with daily life.  Baseline: (+) R posterior canalithiasis  Goal status: INITIAL  2. Further vestibular testing if needed with goal written as appropriate.  Baseline:  Goal status: INITIAL    ASSESSMENT:  CLINICAL IMPRESSION: Today's skilled session focused on re-assessment of R posterior canalithiasis. Pt continued to have R upbeating rotary nystagmus in R DixHallpike, lasting approx  15 seconds. Treated with x2 reps of the Epley maneuver with pt demonstrating no nystagmus/spinning after 2 reps. Pt did report feeling a little off balance and having a funny feeling in her head afterwards. Did not do any further testing due to appeared resolution. Will re-assess at next session.    OBJECTIVE IMPAIRMENTS: decreased activity tolerance, decreased balance, and dizziness.   ACTIVITY LIMITATIONS: bending, transfers, bed mobility, and locomotion level  PARTICIPATION LIMITATIONS: cleaning, laundry, driving, and community activity  PERSONAL FACTORS: Behavior pattern, Past/current experiences, Time since onset of injury/illness/exacerbation, and 3+ comorbidities: : vestibular migraines, hx of DVT, hypothyroidism, lipoedema , trigeminal neuralgia  are also affecting patient's functional outcome.   REHAB POTENTIAL: Good  CLINICAL DECISION MAKING: Stable/uncomplicated  EVALUATION COMPLEXITY: Low   PLAN:  PT FREQUENCY: 1-2x/week  PT DURATION: 4 weeks  PLANNED INTERVENTIONS: [id number]- PT Re-evaluation, 97110-Therapeutic exercises, 97530- Therapeutic activity, [id number]- Neuromuscular re-education, 97535- Self Care, [id number]- Manual therapy, [phone]- Canalith repositioning, Patient/Family education, Balance training, and Vestibular training  PLAN FOR NEXT SESSION: re-check R posterior canal  and treat as needed    [place]ffield LOISE Senate, PT, DPT 09/22/2024, 12:13 PM  "

## 2024-09-26 ENCOUNTER — Ambulatory Visit: Payer: Self-pay | Admitting: Physical Therapy

## 2024-09-27 ENCOUNTER — Ambulatory Visit: Admitting: Physical Therapy

## 2024-12-11 ENCOUNTER — Telehealth: Payer: Self-pay | Admitting: Neurology
